# Patient Record
Sex: Female | Born: 1960 | Race: White | Hispanic: No | Marital: Married | Smoking: Never smoker
Health system: Southern US, Community
[De-identification: ages and names within clinical notes are randomized; demographics above are authoritative.]

## PROBLEM LIST (undated history)

## (undated) DIAGNOSIS — F419 Anxiety disorder, unspecified: Secondary | ICD-10-CM

## (undated) DIAGNOSIS — F32A Depression, unspecified: Secondary | ICD-10-CM

## (undated) DIAGNOSIS — Z8669 Personal history of other diseases of the nervous system and sense organs: Secondary | ICD-10-CM

## (undated) DIAGNOSIS — K219 Gastro-esophageal reflux disease without esophagitis: Secondary | ICD-10-CM

## (undated) DIAGNOSIS — M1990 Unspecified osteoarthritis, unspecified site: Secondary | ICD-10-CM

## (undated) DIAGNOSIS — F329 Major depressive disorder, single episode, unspecified: Secondary | ICD-10-CM

## (undated) HISTORY — PX: ABDOMINAL HYSTERECTOMY: SHX81

## (undated) HISTORY — PX: ABDOMINAL EXPLORATION SURGERY: SHX538

## (undated) HISTORY — PX: CHOLECYSTECTOMY: SHX55

---

## 2000-02-04 HISTORY — PX: GASTRIC BYPASS: SHX52

## 2000-02-21 ENCOUNTER — Encounter: Admission: RE | Admit: 2000-02-21 | Discharge: 2000-02-21 | Payer: Self-pay | Admitting: Gastroenterology

## 2000-02-21 ENCOUNTER — Encounter: Payer: Self-pay | Admitting: Gastroenterology

## 2000-05-20 ENCOUNTER — Ambulatory Visit (HOSPITAL_COMMUNITY): Admission: RE | Admit: 2000-05-20 | Discharge: 2000-05-20 | Payer: Self-pay | Admitting: Gastroenterology

## 2000-05-20 ENCOUNTER — Encounter (INDEPENDENT_AMBULATORY_CARE_PROVIDER_SITE_OTHER): Payer: Self-pay | Admitting: *Deleted

## 2000-07-22 ENCOUNTER — Ambulatory Visit (HOSPITAL_COMMUNITY): Admission: RE | Admit: 2000-07-22 | Discharge: 2000-07-22 | Payer: Self-pay | Admitting: Internal Medicine

## 2000-07-22 ENCOUNTER — Encounter: Payer: Self-pay | Admitting: Internal Medicine

## 2001-07-26 ENCOUNTER — Other Ambulatory Visit: Admission: RE | Admit: 2001-07-26 | Discharge: 2001-07-26 | Payer: Self-pay | Admitting: Obstetrics and Gynecology

## 2001-10-26 ENCOUNTER — Encounter: Payer: Self-pay | Admitting: Internal Medicine

## 2001-10-26 ENCOUNTER — Ambulatory Visit (HOSPITAL_COMMUNITY): Admission: RE | Admit: 2001-10-26 | Discharge: 2001-10-26 | Payer: Self-pay | Admitting: Internal Medicine

## 2001-11-05 ENCOUNTER — Encounter: Payer: Self-pay | Admitting: Internal Medicine

## 2001-11-05 ENCOUNTER — Ambulatory Visit (HOSPITAL_COMMUNITY): Admission: RE | Admit: 2001-11-05 | Discharge: 2001-11-05 | Payer: Self-pay | Admitting: Internal Medicine

## 2002-09-19 ENCOUNTER — Other Ambulatory Visit: Admission: RE | Admit: 2002-09-19 | Discharge: 2002-09-19 | Payer: Self-pay | Admitting: Obstetrics and Gynecology

## 2003-01-12 ENCOUNTER — Ambulatory Visit (HOSPITAL_COMMUNITY): Admission: RE | Admit: 2003-01-12 | Discharge: 2003-01-12 | Payer: Self-pay | Admitting: Ophthalmology

## 2004-06-12 ENCOUNTER — Other Ambulatory Visit: Admission: RE | Admit: 2004-06-12 | Discharge: 2004-06-12 | Payer: Self-pay | Admitting: Obstetrics and Gynecology

## 2004-06-13 ENCOUNTER — Ambulatory Visit (HOSPITAL_COMMUNITY): Admission: RE | Admit: 2004-06-13 | Discharge: 2004-06-13 | Payer: Self-pay | Admitting: Obstetrics and Gynecology

## 2005-06-24 ENCOUNTER — Ambulatory Visit (HOSPITAL_COMMUNITY): Admission: RE | Admit: 2005-06-24 | Discharge: 2005-06-24 | Payer: Self-pay | Admitting: Internal Medicine

## 2005-07-04 ENCOUNTER — Encounter: Admission: RE | Admit: 2005-07-04 | Discharge: 2005-07-04 | Payer: Self-pay | Admitting: Internal Medicine

## 2005-08-11 ENCOUNTER — Encounter: Admission: RE | Admit: 2005-08-11 | Discharge: 2005-08-11 | Payer: Self-pay | Admitting: Internal Medicine

## 2006-01-29 ENCOUNTER — Other Ambulatory Visit: Admission: RE | Admit: 2006-01-29 | Discharge: 2006-01-29 | Payer: Self-pay | Admitting: Obstetrics and Gynecology

## 2006-09-21 ENCOUNTER — Ambulatory Visit (HOSPITAL_COMMUNITY): Admission: RE | Admit: 2006-09-21 | Discharge: 2006-09-21 | Payer: Self-pay | Admitting: Internal Medicine

## 2006-10-16 ENCOUNTER — Encounter: Admission: RE | Admit: 2006-10-16 | Discharge: 2006-10-16 | Payer: Self-pay | Admitting: Surgery

## 2006-11-30 ENCOUNTER — Ambulatory Visit (HOSPITAL_COMMUNITY): Admission: RE | Admit: 2006-11-30 | Discharge: 2006-11-30 | Payer: Self-pay | Admitting: Internal Medicine

## 2008-04-11 ENCOUNTER — Ambulatory Visit (HOSPITAL_COMMUNITY): Admission: RE | Admit: 2008-04-11 | Discharge: 2008-04-11 | Payer: Self-pay | Admitting: Internal Medicine

## 2008-05-03 ENCOUNTER — Ambulatory Visit (HOSPITAL_COMMUNITY): Admission: RE | Admit: 2008-05-03 | Discharge: 2008-05-03 | Payer: Self-pay | Admitting: Orthopedic Surgery

## 2008-10-13 ENCOUNTER — Encounter: Payer: Self-pay | Admitting: Obstetrics and Gynecology

## 2008-10-13 ENCOUNTER — Ambulatory Visit: Payer: Self-pay | Admitting: Obstetrics and Gynecology

## 2008-10-13 ENCOUNTER — Other Ambulatory Visit: Admission: RE | Admit: 2008-10-13 | Discharge: 2008-10-13 | Payer: Self-pay | Admitting: Obstetrics and Gynecology

## 2008-12-22 ENCOUNTER — Ambulatory Visit: Payer: Self-pay | Admitting: Women's Health

## 2009-11-02 ENCOUNTER — Ambulatory Visit (HOSPITAL_COMMUNITY): Admission: RE | Admit: 2009-11-02 | Discharge: 2009-11-02 | Payer: Self-pay | Admitting: Internal Medicine

## 2010-02-24 ENCOUNTER — Encounter: Payer: Self-pay | Admitting: Internal Medicine

## 2010-06-21 NOTE — Procedures (Signed)
[place]  Patient:    Allison Watts, Allison Watts                     MRN: [id number] Proc. Date: 05/20/00 Adm. Date:  [id number] Attending:  Charna Elizabeth CC:         Dr. Carylon Perches, Louann   Procedure Report  DATE OF BIRTH:  01/29/1961  REFERRING PHYSICIAN:  Dr. Carylon Perches, [place]  PROCEDURE PERFORMED:  Colonoscopy with biopsies.  ENDOSCOPIST:  Anselmo Rod, M.D.  INSTRUMENT USED:  Olympus video colonoscope.  INDICATIONS FOR PROCEDURE:  Guaiac positive stools, history of colon cancer and breast cancer, rule out colonic polyps, masses, hemorrhoids etc.  PREPROCEDURE PREPARATION:  Informed consent was procured from the patient. The patient was fasted for eight hours prior to the procedure and prepped with a bottle of magnesium citrate and a gallon of NuLytely the night prior to the procedure.  PREPROCEDURE PHYSICAL:  The patient had stable vital signs.  Neck supple. Chest clear to auscultation.  S1, S2 regular.  Abdomen soft with normal abdominal bowel sounds.  DESCRIPTION OF PROCEDURE:  The patient was placed in the left lateral decubitus position and sedated with 40 mg of Demerol and 4 mg of Versed intravenously.  Once the patient was adequately sedated and maintained on low-flow oxygen and continuous cardiac monitoring, the Olympus video colonoscope was advanced from the rectum to the cecum without difficulty.  The entire colonic mucosa appeared healthy except for a prominent fold at 20 cm that was biopsied for pathology.  This was somewhat "soft" to biopsy.  No other masses, polyps, erosions or ulcerations were seen.  The patient tolerated the procedure well without complication.  IMPRESSION:  Prominent fold at 20 cm biopsied for pathology.  Otherwise normal colon.  RECOMMENDATIONS: 1. Await pathology results. 2. Repeat guaiac studies. 3. Outpatient follow-up in the next two weeks.  RECOMMENDATIONS:DD:  05/20/00 TD:   05/20/00 Job: [id number]

## 2010-09-19 ENCOUNTER — Telehealth (INDEPENDENT_AMBULATORY_CARE_PROVIDER_SITE_OTHER): Payer: Self-pay | Admitting: *Deleted

## 2010-09-19 DIAGNOSIS — R195 Other fecal abnormalities: Secondary | ICD-10-CM

## 2010-09-19 DIAGNOSIS — Z800 Family history of malignant neoplasm of digestive organs: Secondary | ICD-10-CM

## 2010-09-19 DIAGNOSIS — R1013 Epigastric pain: Secondary | ICD-10-CM

## 2010-09-19 MED ORDER — BISACODYL-PEG-KCL-NABICAR-NACL 5-210 MG-GM PO KIT: 1.0000 | PACK | Freq: Once | ORAL | Status: DC

## 2010-09-19 NOTE — Telephone Encounter (Signed)
TCS/EGD sch'd 09/27/10 @ 8:30 (7:30), half-lytely instructions mailed

## 2010-09-26 MED ORDER — SODIUM CHLORIDE 0.45 % IV SOLN
Freq: Once | INTRAVENOUS | Status: AC
  Administered 2010-09-27: 08:00:00 via INTRAVENOUS

## 2010-09-27 ENCOUNTER — Ambulatory Visit (HOSPITAL_COMMUNITY)
Admission: RE | Admit: 2010-09-27 | Discharge: 2010-09-27 | Disposition: A | Discharge status: Home / Self Care | Payer: Managed Care, Other (non HMO) | Source: Ambulatory Visit | Attending: Internal Medicine | Admitting: Internal Medicine

## 2010-09-27 ENCOUNTER — Encounter (HOSPITAL_COMMUNITY)
Admission: RE | Disposition: A | Discharge status: Home / Self Care | Payer: Self-pay | Source: Ambulatory Visit | Attending: Internal Medicine

## 2010-09-27 ENCOUNTER — Other Ambulatory Visit (INDEPENDENT_AMBULATORY_CARE_PROVIDER_SITE_OTHER): Payer: Self-pay | Admitting: Internal Medicine

## 2010-09-27 ENCOUNTER — Encounter (HOSPITAL_COMMUNITY): Payer: Self-pay

## 2010-09-27 DIAGNOSIS — D126 Benign neoplasm of colon, unspecified: Secondary | ICD-10-CM | POA: Insufficient documentation

## 2010-09-27 DIAGNOSIS — K644 Residual hemorrhoidal skin tags: Secondary | ICD-10-CM

## 2010-09-27 DIAGNOSIS — K449 Diaphragmatic hernia without obstruction or gangrene: Secondary | ICD-10-CM

## 2010-09-27 DIAGNOSIS — R1013 Epigastric pain: Secondary | ICD-10-CM | POA: Insufficient documentation

## 2010-09-27 DIAGNOSIS — K921 Melena: Secondary | ICD-10-CM

## 2010-09-27 HISTORY — PX: COLONOSCOPY: SHX5424

## 2010-09-27 HISTORY — PX: ESOPHAGOGASTRODUODENOSCOPY: SHX5428

## 2010-09-27 HISTORY — DX: Gastro-esophageal reflux disease without esophagitis: K21.9

## 2010-09-27 SURGERY — COLONOSCOPY
Anesthesia: Moderate Sedation

## 2010-09-27 MED ORDER — MIDAZOLAM HCL 5 MG/5ML IJ SOLN
INTRAMUSCULAR | Status: AC
  Filled 2010-09-27: qty 5

## 2010-09-27 MED ORDER — MIDAZOLAM HCL 5 MG/5ML IJ SOLN
INTRAMUSCULAR | Status: DC | PRN
  Administered 2010-09-27 (×6): 2 mg via INTRAVENOUS

## 2010-09-27 MED ORDER — MEPERIDINE HCL 50 MG/ML IJ SOLN
INTRAMUSCULAR | Status: AC
  Filled 2010-09-27: qty 1

## 2010-09-27 MED ORDER — MIDAZOLAM HCL 5 MG/5ML IJ SOLN
INTRAMUSCULAR | Status: AC
  Filled 2010-09-27: qty 10

## 2010-09-27 MED ORDER — BUTAMBEN-TETRACAINE-BENZOCAINE 2-2-14 % EX AERO
INHALATION_SPRAY | CUTANEOUS | Status: DC | PRN
  Administered 2010-09-27: 2 via TOPICAL

## 2010-09-27 MED ORDER — STERILE WATER FOR IRRIGATION IR SOLN
Status: DC | PRN
  Administered 2010-09-27: 09:00:00

## 2010-09-27 MED ORDER — MEPERIDINE HCL 25 MG/ML IJ SOLN
INTRAMUSCULAR | Status: DC | PRN
  Administered 2010-09-27 (×2): 25 mg via INTRAVENOUS

## 2010-09-27 NOTE — H&P (Signed)
Allison Watts is an 50 y.o. female.   Chief Complaint: She is here for a esophagogastroduodenoscopy and colonoscopy HPI: Patient is a 50 year old Caucasian female who's been experiencing epigastric pain for the last 3-4 weeks. She denies nausea vomiting or melena. Patient states her heartburn is well controlled with PPI. However if she stops her protonix  heartburn returns. She denies dysphagia she was recently seen by Dr. Ouida Sills and noted to have heme-positive stool. She is here for EGD and colonoscopy. Patient does not take any OTC NSAIDs except occasionally for headache. Family history is positive for a breast carcinoma in her mother died at age 75; primary was diagnosed several years earlier she. She had metastatic disease. Paternal grandmother had colon carcinoma and early 63s.  Past Medical History  Diagnosis Date  . GERD (gastroesophageal reflux disease)     Past Surgical History  Procedure Date  . Gastric bypass 2002  . Abdominal hysterectomy   . Cholecystectomy     Family History  Problem Relation Age of Onset  . Breast cancer Mother   . Hypertension Mother    Social History:  reports that she has never smoked. She has never used smokeless tobacco. She reports that she drinks about .6 ounces of alcohol per week. She reports that she does not use illicit drugs.  Allergies: No Known Allergies  Medications Prior to Admission  Medication Dose Route Frequency Provider Last Rate Last Dose  . 0.45 % sodium chloride infusion   Intravenous Once Malissa Hippo, MD 20 mL/hr at 09/27/10 0815    . meperidine (DEMEROL) 50 MG/ML injection           . midazolam (VERSED) 5 MG/5ML injection            Medications Prior to Admission  Medication Sig Dispense Refill  . buPROPion (WELLBUTRIN XL) 150 MG 24 hr tablet Take 150 mg by mouth daily.        Marland Kitchen estradiol (ESTRACE) 0.5 MG tablet Take 0.5 mg by mouth daily.        . pantoprazole (PROTONIX) 20 MG tablet Take 20 mg by mouth daily.         Marland Kitchen zolpidem (AMBIEN) 5 MG tablet Take 5 mg by mouth at bedtime as needed.          No results found for this or any previous visit (from the past 48 hour(s)). No results found.  Review of Systems  Constitutional: Negative for weight loss.  Gastrointestinal: Negative for nausea, vomiting, diarrhea, constipation and melena. Heartburn: well controlled with PPI. Abdominal pain: epigastric region for 3 weeks. Blood in stool: occ. but not recently.    Blood pressure 121/75, pulse 73, temperature 97.8 F (36.6 C), temperature source Oral, resp. rate 18, height 5\' 7"  (1.702 m), weight 270 lb (122.471 kg), SpO2 96.00%. Physical Exam  Constitutional: She appears well-developed and well-nourished.  HENT:  Mouth/Throat: Oropharynx is clear and moist.  Eyes: Conjunctivae are normal. No scleral icterus.  Neck: No thyromegaly present.  Cardiovascular: Normal rate, regular rhythm and normal heart sounds.   No murmur heard. Respiratory: Breath sounds normal.  GI: Soft. She exhibits no distension and no mass. There is no tenderness.  Musculoskeletal: She exhibits no edema.  Lymphadenopathy:    She has no cervical adenopathy.  Neurological: She is alert.  Skin: Skin is warm and dry.     Assessment/Plan Epigastric pain. 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 Heme positive stool. Family history of colon carcinoma  and a second-degree relative Diagnostic esophagogastroduodenoscopy followed by colonoscopy.  Saveah Bahar U 09/27/2010, 8:49 AM

## 2010-09-27 NOTE — Op Note (Signed)
EGD  and CLONOSCOPY  PROCEDURE REPORT  PATIENT:  Allison Watts  MR#:  [id number] Birthdate:  October 10, 1960, 50 y.o., female Endoscopist:  Dr. Malissa Hippo, MD Referred By:  Dr. Carylon Perches, MD Procedure Date: 09/27/2010  Procedure:   EGD & Colonoscopy  Indications:  Epigastric pain unresponsive to PPI. Patient is status post Roux-en-Y procedure for obesity about 10 years ago. Heme positive stool.   Informed Consent: Seizure and risks were reviewed with the patient. Her questions were answered. Informed consent was obtained.  Medications:  Demerol 50 mg IV Versed 12 mg IV Cetacaine spray topically for oropharyngeal anesthesia  EGD  Description of procedure:  The endoscope was introduced through the mouth and advanced to the second portion of the duodenum without difficulty or limitations. The mucosal surfaces were surveyed very carefully during advancement of the scope and upon withdrawal.  Findings:  Esophagus:  Normal mucosa throughout. GEJ:  39 cm Hiatus:  41 cm Stomach:  Moderate size gastric pouch allowing easy retroflexion; patent gastrojejunostomy. Focal area with edema and erythema with a scar proximal to the anastomosis.  Jejunum:  Jejunal mucosa examined for 20 cm and was normal  Therapeutic/Diagnostic Maneuvers Performed:  None  COLONOSCOPY Description of procedure:  After a digital rectal exam was performed, that colonoscope was advanced from the anus through the rectum and colon to the area of the cecum, ileocecal valve and appendiceal orifice. The cecum was deeply intubated. These structures were well-seen and photographed for the record. From the level of the cecum and ileocecal valve, the scope was slowly and cautiously withdrawn. The mucosal surfaces were carefully surveyed utilizing scope tip to flexion to facilitate fold flattening as needed. The scope was pulled down into the rectum where a thorough exam including retroflexion was performed.  Findings:   Prep  satisfactory. 3 mm polyp ablated via cold biopsy from transverse colon. Small external hemorrhoids.  Therapeutic/Diagnostic Maneuvers Performed:  None  Complications:  None  Cecal Withdrawal Time:  11 minutes  Impression:  Small sliding-type hernia without erosive esophagitis. Focal gastritis with a scar proximal to gastrojejunostomy. Moderate size gastric remnant. Normal jejunal mucosa for 20 cm. small polyp ablated via cold biopsy from transverse colon. External hemorrhoids  Recommendations:  Will check her H. pylori serology today. Physician will contact patient with results of biopsy and blood test. She will continue protonix as before.  Sterling Ucci U  09/27/2010 9:30 AM  CC: Dr. Carylon Perches, MD & Dr. Bonnetta Barry ref. provider found

## 2010-10-03 ENCOUNTER — Telehealth (INDEPENDENT_AMBULATORY_CARE_PROVIDER_SITE_OTHER): Payer: Self-pay | Admitting: *Deleted

## 2010-10-03 ENCOUNTER — Encounter (HOSPITAL_COMMUNITY): Payer: Self-pay | Admitting: Internal Medicine

## 2010-10-03 DIAGNOSIS — K2970 Gastritis, unspecified, without bleeding: Secondary | ICD-10-CM

## 2010-10-03 DIAGNOSIS — K449 Diaphragmatic hernia without obstruction or gangrene: Secondary | ICD-10-CM

## 2010-10-03 DIAGNOSIS — K219 Gastro-esophageal reflux disease without esophagitis: Secondary | ICD-10-CM

## 2010-10-03 NOTE — Telephone Encounter (Signed)
The Patient will have this drawn the week of 10-07-10. Order has been faxed to [facility].

## 2010-10-03 NOTE — Progress Notes (Signed)
Lab order for H-Pylori has been sent to [facility]. The patient will go next week,10-07-10.

## 2010-10-11 ENCOUNTER — Other Ambulatory Visit (INDEPENDENT_AMBULATORY_CARE_PROVIDER_SITE_OTHER): Payer: Self-pay | Admitting: Internal Medicine

## 2010-10-14 LAB — H. PYLORI ANTIBODY, IGG: H Pylori IgG: <0.4 {ISR}

## 2010-10-25 ENCOUNTER — Encounter (INDEPENDENT_AMBULATORY_CARE_PROVIDER_SITE_OTHER): Payer: Self-pay | Admitting: *Deleted

## 2010-11-15 ENCOUNTER — Other Ambulatory Visit (HOSPITAL_COMMUNITY): Payer: Self-pay | Admitting: Internal Medicine

## 2010-11-15 DIAGNOSIS — Z139 Encounter for screening, unspecified: Secondary | ICD-10-CM

## 2010-11-18 ENCOUNTER — Ambulatory Visit (HOSPITAL_COMMUNITY)
Admission: RE | Admit: 2010-11-18 | Discharge: 2010-11-18 | Disposition: A | Discharge status: Home / Self Care | Payer: Managed Care, Other (non HMO) | Source: Ambulatory Visit | Attending: Internal Medicine | Admitting: Internal Medicine

## 2010-11-18 DIAGNOSIS — Z139 Encounter for screening, unspecified: Secondary | ICD-10-CM

## 2010-11-18 DIAGNOSIS — Z1231 Encounter for screening mammogram for malignant neoplasm of breast: Secondary | ICD-10-CM | POA: Insufficient documentation

## 2011-01-09 ENCOUNTER — Ambulatory Visit (HOSPITAL_COMMUNITY)
Admission: RE | Admit: 2011-01-09 | Discharge: 2011-01-09 | Disposition: A | Discharge status: Home / Self Care | Payer: Managed Care, Other (non HMO) | Source: Ambulatory Visit | Attending: Internal Medicine | Admitting: Internal Medicine

## 2011-01-09 ENCOUNTER — Other Ambulatory Visit (HOSPITAL_COMMUNITY): Payer: Self-pay | Admitting: Internal Medicine

## 2011-01-09 DIAGNOSIS — R05 Cough: Secondary | ICD-10-CM

## 2011-01-09 DIAGNOSIS — R059 Cough, unspecified: Secondary | ICD-10-CM | POA: Insufficient documentation

## 2011-01-09 DIAGNOSIS — R918 Other nonspecific abnormal finding of lung field: Secondary | ICD-10-CM | POA: Insufficient documentation

## 2011-02-11 ENCOUNTER — Other Ambulatory Visit (HOSPITAL_COMMUNITY): Payer: Self-pay | Admitting: Internal Medicine

## 2011-02-11 ENCOUNTER — Ambulatory Visit (HOSPITAL_COMMUNITY)
Admission: RE | Admit: 2011-02-11 | Discharge: 2011-02-11 | Disposition: A | Discharge status: Home / Self Care | Payer: Managed Care, Other (non HMO) | Source: Ambulatory Visit | Attending: Internal Medicine | Admitting: Internal Medicine

## 2011-02-11 DIAGNOSIS — R0602 Shortness of breath: Secondary | ICD-10-CM | POA: Insufficient documentation

## 2011-02-11 DIAGNOSIS — Z8701 Personal history of pneumonia (recurrent): Secondary | ICD-10-CM | POA: Insufficient documentation

## 2011-10-24 ENCOUNTER — Other Ambulatory Visit (HOSPITAL_COMMUNITY): Payer: Self-pay | Admitting: Internal Medicine

## 2011-10-24 DIAGNOSIS — Z139 Encounter for screening, unspecified: Secondary | ICD-10-CM

## 2011-11-20 ENCOUNTER — Ambulatory Visit (HOSPITAL_COMMUNITY)
Admission: RE | Admit: 2011-11-20 | Discharge: 2011-11-20 | Disposition: A | Discharge status: Home / Self Care | Payer: Managed Care, Other (non HMO) | Source: Ambulatory Visit | Attending: Internal Medicine | Admitting: Internal Medicine

## 2011-11-20 DIAGNOSIS — Z1231 Encounter for screening mammogram for malignant neoplasm of breast: Secondary | ICD-10-CM | POA: Insufficient documentation

## 2011-11-20 DIAGNOSIS — Z139 Encounter for screening, unspecified: Secondary | ICD-10-CM

## 2012-04-02 ENCOUNTER — Other Ambulatory Visit (HOSPITAL_COMMUNITY): Payer: Self-pay | Admitting: Orthopedic Surgery

## 2012-04-02 DIAGNOSIS — M25561 Pain in right knee: Secondary | ICD-10-CM

## 2012-04-07 ENCOUNTER — Ambulatory Visit (HOSPITAL_COMMUNITY)
Admission: RE | Admit: 2012-04-07 | Discharge: 2012-04-07 | Disposition: A | Discharge status: Home / Self Care | Payer: Managed Care, Other (non HMO) | Source: Ambulatory Visit | Attending: Orthopedic Surgery | Admitting: Orthopedic Surgery

## 2012-04-07 DIAGNOSIS — M25561 Pain in right knee: Secondary | ICD-10-CM

## 2012-04-07 DIAGNOSIS — M25569 Pain in unspecified knee: Secondary | ICD-10-CM | POA: Insufficient documentation

## 2012-04-07 DIAGNOSIS — IMO0002 Reserved for concepts with insufficient information to code with codable children: Secondary | ICD-10-CM | POA: Insufficient documentation

## 2012-04-07 DIAGNOSIS — X500XXA Overexertion from strenuous movement or load, initial encounter: Secondary | ICD-10-CM | POA: Insufficient documentation

## 2012-10-20 ENCOUNTER — Other Ambulatory Visit (HOSPITAL_COMMUNITY): Payer: Self-pay | Admitting: Internal Medicine

## 2012-10-20 DIAGNOSIS — Z139 Encounter for screening, unspecified: Secondary | ICD-10-CM

## 2012-11-22 ENCOUNTER — Ambulatory Visit (HOSPITAL_COMMUNITY)
Admission: RE | Admit: 2012-11-22 | Discharge: 2012-11-22 | Disposition: A | Discharge status: Home / Self Care | Payer: Managed Care, Other (non HMO) | Source: Ambulatory Visit | Attending: Internal Medicine | Admitting: Internal Medicine

## 2012-11-22 DIAGNOSIS — Z1231 Encounter for screening mammogram for malignant neoplasm of breast: Secondary | ICD-10-CM | POA: Insufficient documentation

## 2012-11-22 DIAGNOSIS — Z139 Encounter for screening, unspecified: Secondary | ICD-10-CM

## 2013-10-25 ENCOUNTER — Other Ambulatory Visit (HOSPITAL_COMMUNITY): Payer: Self-pay | Admitting: Internal Medicine

## 2013-10-25 DIAGNOSIS — Z1231 Encounter for screening mammogram for malignant neoplasm of breast: Secondary | ICD-10-CM

## 2013-11-23 ENCOUNTER — Ambulatory Visit (HOSPITAL_COMMUNITY): Payer: Managed Care, Other (non HMO)

## 2013-11-25 ENCOUNTER — Ambulatory Visit (HOSPITAL_COMMUNITY)
Admission: RE | Admit: 2013-11-25 | Discharge: 2013-11-25 | Disposition: A | Discharge status: Home / Self Care | Payer: Managed Care, Other (non HMO) | Source: Ambulatory Visit | Attending: Internal Medicine | Admitting: Internal Medicine

## 2013-11-25 ENCOUNTER — Ambulatory Visit (HOSPITAL_COMMUNITY): Payer: Managed Care, Other (non HMO)

## 2013-11-25 DIAGNOSIS — Z1231 Encounter for screening mammogram for malignant neoplasm of breast: Secondary | ICD-10-CM | POA: Diagnosis present

## 2013-11-30 ENCOUNTER — Ambulatory Visit (HOSPITAL_COMMUNITY): Payer: Managed Care, Other (non HMO)

## 2013-12-02 ENCOUNTER — Ambulatory Visit (HOSPITAL_COMMUNITY): Admission: RE | Admit: 2013-12-02 | Payer: Managed Care, Other (non HMO) | Source: Ambulatory Visit

## 2014-10-23 ENCOUNTER — Other Ambulatory Visit (HOSPITAL_COMMUNITY): Payer: Self-pay | Admitting: Internal Medicine

## 2014-10-23 DIAGNOSIS — Z1231 Encounter for screening mammogram for malignant neoplasm of breast: Secondary | ICD-10-CM

## 2014-11-27 ENCOUNTER — Ambulatory Visit (HOSPITAL_COMMUNITY)
Admission: RE | Admit: 2014-11-27 | Discharge: 2014-11-27 | Disposition: A | Discharge status: Home / Self Care | Payer: Managed Care, Other (non HMO) | Source: Ambulatory Visit | Attending: Internal Medicine | Admitting: Internal Medicine

## 2014-11-27 DIAGNOSIS — Z1231 Encounter for screening mammogram for malignant neoplasm of breast: Secondary | ICD-10-CM | POA: Diagnosis present

## 2015-09-28 ENCOUNTER — Ambulatory Visit: Payer: Self-pay | Admitting: Surgery

## 2015-09-28 NOTE — H&P (Signed)
History of Present Illness Allison Watts. [name] MD; 09/28/2015 11:07 AM) The patient is a 54 year old female who presents with an incisional hernia. Referred by Dr. Asencion Noble for ventral hernia  This is a 55 year old female who is status post laparoscopic cholecystectomy and laparoscopic gastric bypass in 2001. She also has had a laparoscopic oophorectomy and hysterectomy. About 10 years ago she began noticing some swelling to the right of her umbilicus. This area would resolve when she was supine. The swelling tends to get worse when she is standing. I actually saw her in consultation on 10/09/06. A CT scan was performed around that time that showed a small ventral incisional hernia. However the patient never followed up and the hernia has not been bothering her until recently. About 2 months ago she began noticing increasing swelling as well as tenderness in this area. The pain resolves when she is supine. She denies any obstructive symptoms. There is not been a discoloration of the skin overlying the hernia. No further imaging has been performed.   CT scan 10/16/06 Clinical Data: Abdominal pain for 6 months. Question umbilical hernia.  Technique: Multidetector CT imaging of the abdomen and pelvis was performed following the standard protocol during bolus administration of intravenous contrast.  Contrast: 125 cc Omnipaque 300  Comparison: Report of CT of 02/21/2000.  ABDOMEN CT WITH CONTRAST  Findings: Minimal right base atelectasis or scar. Mild cardiomegaly. No pericardial or pleural effusion  Mild fatty infiltration of liver. Hepatomegaly at 18.7 cm. Cholecystectomy. right hepatic lobe 1.2 cm lesion on image 45 likely a cyst. Splenule.  Status post gastric bypass. Contrast in the bypassed stomach could be retrograde via the pancreatic biliary limb or represent a gastric gastric fistula. Antecolic roux loop without evidence of obstruction. Mild prominence of left abdominal  small bowel loop on image 47 is likely due to postoperative atony.  No biliary ductal dilatation.  Normal pancreas, adrenal glands, kidneys  No retroperitoneal or retrocrural adenopathy . Apparent mild colonic wall thickening on images 54-57 could be partially due to underdistention. Coronal 66. normal terminal ileum and appendix. Abdominal small bowel normal without ascites.  Ventral hernia contains only fat on image 47. Inferiorly, a fascial defect is seen with hernia on image 71, right of midline.  IMPRESSION   1. Ventral hernia or hernias containing only fat, as described. 2. 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 This could be secondary to reflux via the pancreatic biliary limb. However, gastric gastric fistula could have this appearance. If this is a clinical concern, upper GI could differentiate. 3. Cannot exclude ascending colonic wall thickening. Correlate with any risk factors or symptoms to suggest colitis. 4. Hepatomegaly and fatty infiltration of the liver.  PELVIS CT WITH CONTRAST  Findings: Pelvic bowel loops normal. No pelvic adenopathy. Normal urinary bladder. Hysterectomy. No free fluid. No adnexal mass. Ileocolic mesenteric lymph node, 7 mm, image 62, not pathologic. Normal bones.  IMPRESSION   1. Hysterectomy but no acute pelvic process.    Other Problems (Allison Watts, CMA; 09/28/2015 10:38 AM) Anxiety Disorder Depression Gastroesophageal Reflux Disease Migraine Headache Oophorectomy  Past Surgical History (Allison Watts, CMA; 09/28/2015 10:38 AM) Colon Polyp Removal - Colonoscopy Gallbladder Surgery - Laparoscopic Gastric Bypass Hysterectomy (not due to cancer) - Complete Knee Surgery Right.  Diagnostic Studies History (Allison Watts, [place]; 09/28/2015 10:38 AM) Colonoscopy 1-5 years ago Mammogram within last year Pap Smear >5 years ago  Allergies (Allison Watts, CMA; 09/28/2015 10:38  AM) No  Known Drug Allergies 09/28/2015  Medication History (Allison Watts, CMA; 09/28/2015 10:39 AM) Pantoprazole Sodium (40MG  Tablet DR, Oral) Active. ALPRAZolam (0.5MG  Tablet, Oral) Active. BuPROPion HCl ER (XL) (150MG  Tablet ER 24HR, Oral) Active. Escitalopram Oxalate (10MG  Tablet, Oral) Active. Medications Reconciled  Social History (Allison Watts, CMA; 09/28/2015 10:38 AM) Alcohol use Moderate alcohol use. Caffeine use Coffee, Tea. No drug use Tobacco use Never smoker.  Family History (Allison Watts, [place]; 09/28/2015 10:38 AM) Breast Cancer Mother. Cerebrovascular Accident Mother. Depression Mother, Sister. Diabetes Mellitus Father. Heart Disease Mother. Heart disease in female family member before age 23 Heart disease in female family member before age 44 Hypertension Sister. Migraine Headache Sister.  Pregnancy / Birth History (Allison Watts, [place]; 09/28/2015 10:38 AM) Age at menarche 31 years. Age of menopause 51-55 Contraceptive History Oral contraceptives. Gravida 0 Irregular periods Para 0     Review of Systems (Allison Watts CMA; 09/28/2015 10:38 AM) General Present- Night Sweats and Weight Gain. Not Present- Appetite Loss, Chills, Fatigue, Fever and Weight Loss. Skin Not Present- Change in Wart/Mole, Dryness, Hives, Jaundice, New Lesions, Non-Healing Wounds, Rash and Ulcer. HEENT Present- Wears glasses/contact lenses. Not Present- Earache, Hearing Loss, Hoarseness, Nose Bleed, Oral Ulcers, Ringing in the Ears, Seasonal Allergies, Sinus Pain, Sore Throat, Visual Disturbances and Yellow Eyes. Respiratory Not Present- Bloody sputum, Chronic Cough, Difficulty Breathing, Snoring and Wheezing. Breast Not Present- Breast Mass, Breast Pain, Nipple Discharge and Skin Changes. Cardiovascular Not Present- Chest Pain, Difficulty Breathing Lying Down, Leg Cramps, Palpitations, Rapid Heart Rate, Shortness of Breath and Swelling of Extremities. Gastrointestinal  Present- Abdominal Pain. Not Present- Bloating, Bloody Stool, Change in Bowel Habits, Chronic diarrhea, Constipation, Difficulty Swallowing, Excessive gas, Gets full quickly at meals, Hemorrhoids, Indigestion, Nausea, Rectal Pain and Vomiting. Female Genitourinary Not Present- Frequency, Nocturia, Painful Urination, Pelvic Pain and Urgency. Musculoskeletal Not Present- Back Pain, Joint Pain, Joint Stiffness, Muscle Pain, Muscle Weakness and Swelling of Extremities. Neurological Not Present- Decreased Memory, Fainting, Headaches, Numbness, Seizures, Tingling, Tremor, Trouble walking and Weakness. Psychiatric Not Present- Anxiety, Bipolar, Change in Sleep Pattern, Depression, Fearful and Frequent crying. Endocrine Present- Hot flashes. Not Present- Cold Intolerance, Excessive Hunger, Hair Changes, Heat Intolerance and New Diabetes. Hematology Not Present- Blood Thinners, Easy Bruising, Excessive bleeding, Gland problems, HIV and Persistent Infections.  Vitals (Allison Watts CMA; 09/28/2015 10:40 AM) 09/28/2015 10:39 AM Weight: 267.25 lb Height: 67in Height was reported by patient. Body Surface Area: 2.29 m Body Mass Index: 41.86 kg/m  Temp.: 98.67F(Oral)  Pulse: 87 (Regular)  P.OX: 93% (Room air) BP: 120/90 (Sitting, Left Arm, Standard)      Physical Exam Rodman Key K. Shelsea Hangartner MD; 09/28/2015 11:06 AM)  The physical exam findings are as follows: Note:WDWN in NAD HEENT: EOMI, sclera anicteric Neck: No masses, no thyromegaly Lungs: CTA bilaterally; normal respiratory effort CV: Regular rate and rhythm; no murmurs Abd: Obese, soft, palpable mass superior and to the right of the umbilicus. This measures about 6 cm across. This hernia reduces when she is supine. I cannot palpate the fascial defect. Ext: Well-perfused; no edema Skin: Warm, dry; no sign of jaundice    Assessment & Plan Rodman Key K. Mirinda Monte MD; 09/28/2015 10:56 AM)  VENTRAL INCISIONAL HERNIA WITHOUT OBSTRUCTION OR  GANGRENE (K43.2)  Current Plans Schedule for Surgery - Laparoscopic ventral hernia repair with mesh. The surgical procedure has been discussed with the patient. Potential risks, benefits, alternative treatments, and expected outcomes have been explained. All of the patient's questions at this time have been answered. The likelihood of reaching  the patient's treatment goal is good. The patient understand the proposed surgical procedure and wishes to proceed. Pt Education - Pamphlet Given - Laparoscopic Hernia Repair: discussed with patient and provided information.  Allison Watts. Georgette Dover, MD, [facility] Surgery  General/ Trauma Surgery  09/28/2015 11:07 AM

## 2015-10-17 ENCOUNTER — Encounter (HOSPITAL_COMMUNITY): Payer: Self-pay

## 2015-10-17 ENCOUNTER — Encounter (HOSPITAL_COMMUNITY)
Admission: RE | Admit: 2015-10-17 | Discharge: 2015-10-17 | Disposition: A | Discharge status: Home / Self Care | Payer: Managed Care, Other (non HMO) | Source: Ambulatory Visit | Attending: Surgery | Admitting: Surgery

## 2015-10-17 DIAGNOSIS — K439 Ventral hernia without obstruction or gangrene: Secondary | ICD-10-CM | POA: Insufficient documentation

## 2015-10-17 DIAGNOSIS — Z01812 Encounter for preprocedural laboratory examination: Secondary | ICD-10-CM | POA: Insufficient documentation

## 2015-10-17 HISTORY — DX: Personal history of other diseases of the nervous system and sense organs: Z86.69

## 2015-10-17 HISTORY — DX: Anxiety disorder, unspecified: F41.9

## 2015-10-17 HISTORY — DX: Major depressive disorder, single episode, unspecified: F32.9

## 2015-10-17 HISTORY — DX: Unspecified osteoarthritis, unspecified site: M19.90

## 2015-10-17 HISTORY — DX: Depression, unspecified: F32.A

## 2015-10-17 LAB — CBC
HEMATOCRIT: 47.2 % — AB (ref 36.0–46.0)
Hemoglobin: 14.9 g/dL (ref 12.0–15.0)
MCH: 31.8 pg (ref 26.0–34.0)
MCHC: 31.6 g/dL (ref 30.0–36.0)
MCV: 100.9 fL — ABNORMAL HIGH (ref 78.0–100.0)
PLATELETS: 292 10*3/uL (ref 150–400)
RBC: 4.68 MIL/uL (ref 3.87–5.11)
RDW: 13.5 % (ref 11.5–15.5)
WBC: 8 10*3/uL (ref 4.0–10.5)

## 2015-10-17 NOTE — Pre-Procedure Instructions (Signed)
Allison Watts  10/17/2015      [facility] - [address], [place] [address] Phone: [phone] Fax: [phone]    Your procedure is scheduled on Tuesday September 19.  Report to [facility] Admitting at 12:30 P.M.  Call this number if you have problems the morning of surgery:  [phone]   Remember:  Do not eat food or drink liquids after midnight.  Take these medicines the morning of surgery with A SIP OF WATER: acetaminophen (tylenol) if needed, alprazolam (Xanax) if needed, pantoprazole (protonix), escitalopram (Lexapro)  7 days prior to surgery STOP taking any Aspirin, Aleve, Naproxen, Ibuprofen, Motrin, Advil, Goody's, BC's, all herbal medications, fish oil, and all vitamins    Do not wear jewelry, make-up or nail polish.  Do not wear lotions, powders, or perfumes, or deoderant.  Do not shave 48 hours prior to surgery.    Do not bring valuables to the hospital.  [facility] is not responsible for any belongings or valuables.  Contacts, dentures or bridgework may not be worn into surgery.  Leave your suitcase in the car.  After surgery it may be brought to your room.  For patients admitted to the hospital, discharge time will be determined by your treatment team.  Patients discharged the day of surgery will not be allowed to drive home.    Special instructions:     [facility]- Preparing For Surgery  Before surgery, you can play an important role. Because skin is not sterile, your skin needs to be as free of germs as possible. You can reduce the number of germs on your skin by washing with CHG (chlorahexidine gluconate) Soap before surgery.  CHG is an antiseptic cleaner which kills germs and bonds with the skin to continue killing germs even after washing.  Please do not use if you have an allergy to CHG or antibacterial soaps. If your skin becomes reddened/irritated stop using the CHG.  Do not shave  (including legs and underarms) for at least 48 hours prior to first CHG shower. It is OK to shave your face.  Please follow these instructions carefully.   1. Shower the NIGHT BEFORE SURGERY and the MORNING OF SURGERY with CHG.   2. If you chose to wash your hair, wash your hair first as usual with your normal shampoo.  3. After you shampoo, rinse your hair and body thoroughly to remove the shampoo.  4. Use CHG as you would any other liquid soap. You can apply CHG directly to the skin and wash gently with a scrungie or a clean washcloth.   5. Apply the CHG Soap to your body ONLY FROM THE NECK DOWN.  Do not use on open wounds or open sores. Avoid contact with your eyes, ears, mouth and genitals (private parts). Wash genitals (private parts) with your normal soap.  6. Wash thoroughly, paying special attention to the area where your surgery will be performed.  7. Thoroughly rinse your body with warm water from the neck down.  8. DO NOT shower/wash with your normal soap after using and rinsing off the CHG Soap.  9. Pat yourself dry with a CLEAN TOWEL.   10. Wear CLEAN PAJAMAS   11. Place CLEAN SHEETS on your bed the night of your first shower and DO NOT SLEEP WITH PETS.    Day of Surgery: Do not apply any deodorants/lotions. Please wear clean clothes to the hospital/surgery center.

## 2015-10-17 NOTE — Progress Notes (Signed)
PCP Asencion Noble Pt with no cardiologist or cardiac workup per pt.   Pt with no complaints of chest pain, shortness of breath or signs of infection.

## 2015-10-22 MED ORDER — DEXTROSE 5 % IV SOLN
3.0000 g | INTRAVENOUS | Status: AC
  Administered 2015-10-23: 3 g via INTRAVENOUS
  Filled 2015-10-22: qty 3000

## 2015-10-23 ENCOUNTER — Ambulatory Visit (HOSPITAL_COMMUNITY): Payer: Managed Care, Other (non HMO) | Admitting: Certified Registered Nurse Anesthetist

## 2015-10-23 ENCOUNTER — Inpatient Hospital Stay (HOSPITAL_COMMUNITY)
Admission: AD | Admit: 2015-10-23 | Discharge: 2015-10-25 | DRG: 354 | Disposition: A | Discharge status: Home / Self Care | Payer: Managed Care, Other (non HMO) | Source: Ambulatory Visit | Attending: Surgery | Admitting: Surgery

## 2015-10-23 ENCOUNTER — Encounter (HOSPITAL_COMMUNITY)
Admission: AD | Disposition: A | Discharge status: Home / Self Care | Payer: Self-pay | Source: Ambulatory Visit | Attending: Surgery

## 2015-10-23 ENCOUNTER — Encounter (HOSPITAL_COMMUNITY): Payer: Self-pay | Admitting: Certified Registered Nurse Anesthetist

## 2015-10-23 DIAGNOSIS — F419 Anxiety disorder, unspecified: Secondary | ICD-10-CM | POA: Diagnosis present

## 2015-10-23 DIAGNOSIS — Z6841 Body Mass Index (BMI) 40.0 and over, adult: Secondary | ICD-10-CM

## 2015-10-23 DIAGNOSIS — K432 Incisional hernia without obstruction or gangrene: Secondary | ICD-10-CM | POA: Diagnosis present

## 2015-10-23 DIAGNOSIS — K219 Gastro-esophageal reflux disease without esophagitis: Secondary | ICD-10-CM | POA: Diagnosis present

## 2015-10-23 DIAGNOSIS — F329 Major depressive disorder, single episode, unspecified: Secondary | ICD-10-CM | POA: Diagnosis present

## 2015-10-23 HISTORY — PX: VENTRAL HERNIA REPAIR: SHX424

## 2015-10-23 HISTORY — PX: INSERTION OF MESH: SHX5868

## 2015-10-23 LAB — CBC
HCT: 44.5 % (ref 36.0–46.0)
Hemoglobin: 13.8 g/dL (ref 12.0–15.0)
MCH: 31.4 pg (ref 26.0–34.0)
MCHC: 31 g/dL (ref 30.0–36.0)
MCV: 101.1 fL — AB (ref 78.0–100.0)
PLATELETS: 313 10*3/uL (ref 150–400)
RBC: 4.4 MIL/uL (ref 3.87–5.11)
RDW: 13.4 % (ref 11.5–15.5)
WBC: 13.4 10*3/uL — AB (ref 4.0–10.5)

## 2015-10-23 LAB — CREATININE, SERUM
Creatinine, Ser: 0.75 mg/dL (ref 0.44–1.00)
GFR calc non Af Amer: >60 mL/min (ref >60)

## 2015-10-23 SURGERY — REPAIR, HERNIA, VENTRAL, LAPAROSCOPIC
Anesthesia: General | Site: Abdomen

## 2015-10-23 MED ORDER — OXYCODONE HCL 5 MG PO TABS
5.0000 mg | ORAL_TABLET | ORAL | Status: DC | PRN
  Administered 2015-10-23 – 2015-10-24 (×2): 10 mg via ORAL
  Administered 2015-10-24: 5 mg via ORAL
  Filled 2015-10-23 (×3): qty 2

## 2015-10-23 MED ORDER — MIDAZOLAM HCL 5 MG/5ML IJ SOLN
INTRAMUSCULAR | Status: DC | PRN
  Administered 2015-10-23: 2 mg via INTRAVENOUS

## 2015-10-23 MED ORDER — SUGAMMADEX SODIUM 200 MG/2ML IV SOLN
INTRAVENOUS | Status: AC
  Filled 2015-10-23: qty 2

## 2015-10-23 MED ORDER — CEFAZOLIN SODIUM-DEXTROSE 2-4 GM/100ML-% IV SOLN
2.0000 g | Freq: Three times a day (TID) | INTRAVENOUS | Status: AC
Start: 2015-10-23 — End: 2015-10-23
  Administered 2015-10-23: 2 g via INTRAVENOUS
  Filled 2015-10-23: qty 100

## 2015-10-23 MED ORDER — ROCURONIUM BROMIDE 10 MG/ML (PF) SYRINGE
PREFILLED_SYRINGE | INTRAVENOUS | Status: AC
  Filled 2015-10-23: qty 10

## 2015-10-23 MED ORDER — FENTANYL CITRATE (PF) 100 MCG/2ML IJ SOLN
INTRAMUSCULAR | Status: AC
  Filled 2015-10-23: qty 2

## 2015-10-23 MED ORDER — ONDANSETRON HCL 4 MG/2ML IJ SOLN
INTRAMUSCULAR | Status: DC | PRN
  Administered 2015-10-23: 4 mg via INTRAVENOUS

## 2015-10-23 MED ORDER — EPHEDRINE SULFATE 50 MG/ML IJ SOLN
INTRAMUSCULAR | Status: DC | PRN
  Administered 2015-10-23: 10 mg via INTRAVENOUS
  Administered 2015-10-23: 20 mg via INTRAVENOUS
  Administered 2015-10-23: 10 mg via INTRAVENOUS

## 2015-10-23 MED ORDER — METHOCARBAMOL 500 MG PO TABS
500.0000 mg | ORAL_TABLET | Freq: Four times a day (QID) | ORAL | Status: DC | PRN
  Administered 2015-10-23 – 2015-10-25 (×4): 500 mg via ORAL
  Filled 2015-10-23 (×5): qty 1

## 2015-10-23 MED ORDER — SODIUM CHLORIDE 0.9 % IR SOLN
Status: DC | PRN
  Administered 2015-10-23: 1000 mL

## 2015-10-23 MED ORDER — BUPIVACAINE-EPINEPHRINE 0.25% -1:200000 IJ SOLN
INTRAMUSCULAR | Status: DC | PRN
  Administered 2015-10-23: 26 mL

## 2015-10-23 MED ORDER — PROPOFOL 10 MG/ML IV BOLUS
INTRAVENOUS | Status: DC | PRN
  Administered 2015-10-23: 50 mg via INTRAVENOUS
  Administered 2015-10-23: 130 mg via INTRAVENOUS

## 2015-10-23 MED ORDER — ACETAMINOPHEN 500 MG PO TABS: 1000.0000 mg | ORAL_TABLET | Freq: Four times a day (QID) | ORAL | Status: DC | PRN

## 2015-10-23 MED ORDER — ZOLPIDEM TARTRATE 5 MG PO TABS: 5.0000 mg | ORAL_TABLET | Freq: Every evening | ORAL | Status: DC | PRN

## 2015-10-23 MED ORDER — MIDAZOLAM HCL 2 MG/2ML IJ SOLN
INTRAMUSCULAR | Status: AC
  Filled 2015-10-23: qty 2

## 2015-10-23 MED ORDER — SUGAMMADEX SODIUM 500 MG/5ML IV SOLN
INTRAVENOUS | Status: AC
  Filled 2015-10-23: qty 5

## 2015-10-23 MED ORDER — DIPHENHYDRAMINE HCL 50 MG/ML IJ SOLN: 25.0000 mg | Freq: Four times a day (QID) | INTRAMUSCULAR | Status: DC | PRN

## 2015-10-23 MED ORDER — ONDANSETRON 4 MG PO TBDP: 4.0000 mg | ORAL_TABLET | Freq: Four times a day (QID) | ORAL | Status: DC | PRN

## 2015-10-23 MED ORDER — HYDROMORPHONE HCL 1 MG/ML IJ SOLN
INTRAMUSCULAR | Status: AC
  Filled 2015-10-23: qty 1

## 2015-10-23 MED ORDER — LIDOCAINE HCL (CARDIAC) 20 MG/ML IV SOLN
INTRAVENOUS | Status: DC | PRN
  Administered 2015-10-23: 60 mg via INTRAVENOUS

## 2015-10-23 MED ORDER — CHLORHEXIDINE GLUCONATE CLOTH 2 % EX PADS: 6.0000 | MEDICATED_PAD | Freq: Once | CUTANEOUS | Status: DC

## 2015-10-23 MED ORDER — LACTATED RINGERS IV SOLN
INTRAVENOUS | Status: DC
  Administered 2015-10-23 – 2015-10-24 (×4): via INTRAVENOUS

## 2015-10-23 MED ORDER — HYDROMORPHONE HCL 1 MG/ML IJ SOLN: 1.0000 mg | INTRAMUSCULAR | Status: DC | PRN

## 2015-10-23 MED ORDER — ALPRAZOLAM 0.25 MG PO TABS
0.2500 mg | ORAL_TABLET | Freq: Every day | ORAL | Status: DC | PRN
  Administered 2015-10-23: 0.5 mg via ORAL
  Filled 2015-10-23: qty 2

## 2015-10-23 MED ORDER — MEPERIDINE HCL 25 MG/ML IJ SOLN: 6.2500 mg | INTRAMUSCULAR | Status: DC | PRN

## 2015-10-23 MED ORDER — MIDAZOLAM HCL 2 MG/2ML IJ SOLN: 0.5000 mg | Freq: Once | INTRAMUSCULAR | Status: DC | PRN

## 2015-10-23 MED ORDER — DIPHENHYDRAMINE HCL 25 MG PO CAPS: 25.0000 mg | ORAL_CAPSULE | Freq: Four times a day (QID) | ORAL | Status: DC | PRN

## 2015-10-23 MED ORDER — ROCURONIUM BROMIDE 100 MG/10ML IV SOLN
INTRAVENOUS | Status: DC | PRN
  Administered 2015-10-23 (×2): 20 mg via INTRAVENOUS
  Administered 2015-10-23: 30 mg via INTRAVENOUS
  Administered 2015-10-23: 50 mg via INTRAVENOUS
  Administered 2015-10-23: 10 mg via INTRAVENOUS

## 2015-10-23 MED ORDER — FENTANYL CITRATE (PF) 100 MCG/2ML IJ SOLN
INTRAMUSCULAR | Status: DC | PRN
  Administered 2015-10-23: 100 ug via INTRAVENOUS
  Administered 2015-10-23: 50 ug via INTRAVENOUS
  Administered 2015-10-23: 100 ug via INTRAVENOUS
  Administered 2015-10-23: 50 ug via INTRAVENOUS
  Administered 2015-10-23: 100 ug via INTRAVENOUS

## 2015-10-23 MED ORDER — ONDANSETRON HCL 4 MG/2ML IJ SOLN: 4.0000 mg | Freq: Four times a day (QID) | INTRAMUSCULAR | Status: DC | PRN

## 2015-10-23 MED ORDER — DEXAMETHASONE SODIUM PHOSPHATE 10 MG/ML IJ SOLN
INTRAMUSCULAR | Status: DC | PRN
  Administered 2015-10-23: 4 mg via INTRAVENOUS

## 2015-10-23 MED ORDER — ENOXAPARIN SODIUM 40 MG/0.4ML ~~LOC~~ SOLN
40.0000 mg | SUBCUTANEOUS | Status: DC
  Administered 2015-10-24: 40 mg via SUBCUTANEOUS
  Filled 2015-10-23: qty 0.4

## 2015-10-23 MED ORDER — HYDROMORPHONE HCL 1 MG/ML IJ SOLN
0.2500 mg | INTRAMUSCULAR | Status: DC | PRN
  Administered 2015-10-23 (×4): 0.5 mg via INTRAVENOUS

## 2015-10-23 MED ORDER — PANTOPRAZOLE SODIUM 40 MG PO TBEC
40.0000 mg | DELAYED_RELEASE_TABLET | Freq: Every day | ORAL | Status: DC
  Administered 2015-10-24: 40 mg via ORAL
  Filled 2015-10-23: qty 1

## 2015-10-23 MED ORDER — ONDANSETRON HCL 4 MG/2ML IJ SOLN
INTRAMUSCULAR | Status: AC
  Filled 2015-10-23: qty 2

## 2015-10-23 MED ORDER — BUPIVACAINE-EPINEPHRINE (PF) 0.25% -1:200000 IJ SOLN
INTRAMUSCULAR | Status: AC
  Filled 2015-10-23: qty 30

## 2015-10-23 MED ORDER — SUGAMMADEX SODIUM 500 MG/5ML IV SOLN
INTRAVENOUS | Status: DC | PRN
  Administered 2015-10-23: 300 mg via INTRAVENOUS

## 2015-10-23 MED ORDER — PROMETHAZINE HCL 25 MG/ML IJ SOLN: 6.2500 mg | INTRAMUSCULAR | Status: DC | PRN

## 2015-10-23 MED ORDER — DOCUSATE SODIUM 100 MG PO CAPS
100.0000 mg | ORAL_CAPSULE | Freq: Two times a day (BID) | ORAL | Status: DC
  Administered 2015-10-23 – 2015-10-24 (×2): 100 mg via ORAL
  Filled 2015-10-23 (×3): qty 1

## 2015-10-23 MED ORDER — LIDOCAINE 2% (20 MG/ML) 5 ML SYRINGE
INTRAMUSCULAR | Status: AC
  Filled 2015-10-23: qty 5

## 2015-10-23 MED ORDER — DEXAMETHASONE SODIUM PHOSPHATE 10 MG/ML IJ SOLN
INTRAMUSCULAR | Status: AC
  Filled 2015-10-23: qty 1

## 2015-10-23 MED ORDER — PROPOFOL 10 MG/ML IV BOLUS
INTRAVENOUS | Status: AC
  Filled 2015-10-23: qty 20

## 2015-10-23 MED ORDER — ESCITALOPRAM OXALATE 10 MG PO TABS
10.0000 mg | ORAL_TABLET | Freq: Every day | ORAL | Status: DC
  Administered 2015-10-24: 10 mg via ORAL
  Filled 2015-10-23: qty 1

## 2015-10-23 MED ORDER — PHENYLEPHRINE HCL 10 MG/ML IJ SOLN
INTRAMUSCULAR | Status: DC | PRN
  Administered 2015-10-23 (×2): 120 ug via INTRAVENOUS

## 2015-10-23 SURGICAL SUPPLY — 52 items
APPLIER CLIP LOGIC TI 5 (MISCELLANEOUS) IMPLANT
APPLIER CLIP ROT 10 11.4 M/L (STAPLE)
BENZOIN TINCTURE PRP APPL 2/3 (GAUZE/BANDAGES/DRESSINGS) ×3 IMPLANT
BINDER ABD UNIV 10 28-50 (GAUZE/BANDAGES/DRESSINGS) ×1 IMPLANT
BINDER ABDOM UNIV 10 (GAUZE/BANDAGES/DRESSINGS) ×3
BINDER ABDOMINAL 12 ML 46-62 (SOFTGOODS) IMPLANT
BLADE SURG ROTATE 9660 (MISCELLANEOUS) IMPLANT
CANISTER SUCTION 2500CC (MISCELLANEOUS) IMPLANT
CHLORAPREP W/TINT 26ML (MISCELLANEOUS) ×3 IMPLANT
CLIP APPLIE ROT 10 11.4 M/L (STAPLE) IMPLANT
CLOSURE WOUND 1/2 X4 (GAUZE/BANDAGES/DRESSINGS) ×1
COVER SURGICAL LIGHT HANDLE (MISCELLANEOUS) ×3 IMPLANT
DECANTER SPIKE VIAL GLASS SM (MISCELLANEOUS) IMPLANT
DERMABOND ADVANCED (GAUZE/BANDAGES/DRESSINGS) ×2
DERMABOND ADVANCED .7 DNX12 (GAUZE/BANDAGES/DRESSINGS) ×1 IMPLANT
DEVICE SECURE STRAP 25 ABSORB (INSTRUMENTS) ×6 IMPLANT
DEVICE TROCAR PUNCTURE CLOSURE (ENDOMECHANICALS) ×3 IMPLANT
DRAPE LAPAROSCOPIC ABDOMINAL (DRAPES) ×3 IMPLANT
DRSG TEGADERM 2-3/8X2-3/4 SM (GAUZE/BANDAGES/DRESSINGS) IMPLANT
ELECT REM PT RETURN 9FT ADLT (ELECTROSURGICAL) ×3
ELECTRODE REM PT RTRN 9FT ADLT (ELECTROSURGICAL) ×1 IMPLANT
FILTER SMOKE EVAC LAPAROSHD (FILTER) IMPLANT
GAUZE SPONGE 2X2 8PLY STRL LF (GAUZE/BANDAGES/DRESSINGS) ×1 IMPLANT
GLOVE BIO SURGEON STRL SZ7 (GLOVE) ×3 IMPLANT
GLOVE BIOGEL PI IND STRL 7.5 (GLOVE) ×1 IMPLANT
GLOVE BIOGEL PI INDICATOR 7.5 (GLOVE) ×2
GOWN STRL REUS W/ TWL LRG LVL3 (GOWN DISPOSABLE) ×4 IMPLANT
GOWN STRL REUS W/TWL LRG LVL3 (GOWN DISPOSABLE) ×8
KIT BASIN OR (CUSTOM PROCEDURE TRAY) ×3 IMPLANT
KIT ROOM TURNOVER OR (KITS) ×3 IMPLANT
MARKER SKIN DUAL TIP RULER LAB (MISCELLANEOUS) ×3 IMPLANT
MESH VENTRALIGHT ST 6X8 (Mesh Specialty) ×2 IMPLANT
MESH VENTRLGHT ELLIPSE 8X6XMFL (Mesh Specialty) ×1 IMPLANT
NEEDLE SPNL 22GX3.5 QUINCKE BK (NEEDLE) ×3 IMPLANT
NS IRRIG 1000ML POUR BTL (IV SOLUTION) ×3 IMPLANT
PAD ARMBOARD 7.5X6 YLW CONV (MISCELLANEOUS) ×6 IMPLANT
SCALPEL HARMONIC ACE (MISCELLANEOUS) ×3 IMPLANT
SCISSORS LAP 5X35 DISP (ENDOMECHANICALS) ×3 IMPLANT
SET IRRIG TUBING LAPAROSCOPIC (IRRIGATION / IRRIGATOR) IMPLANT
SLEEVE ENDOPATH XCEL 5M (ENDOMECHANICALS) ×3 IMPLANT
SPONGE GAUZE 2X2 STER 10/PKG (GAUZE/BANDAGES/DRESSINGS) ×2
STRIP CLOSURE SKIN 1/2X4 (GAUZE/BANDAGES/DRESSINGS) ×2 IMPLANT
SUT MNCRL AB 4-0 PS2 18 (SUTURE) ×3 IMPLANT
SUT NOVA NAB GS-21 0 18 T12 DT (SUTURE) ×6 IMPLANT
TOWEL OR 17X24 6PK STRL BLUE (TOWEL DISPOSABLE) ×3 IMPLANT
TOWEL OR 17X26 10 PK STRL BLUE (TOWEL DISPOSABLE) IMPLANT
TRAY FOLEY CATH 14FRSI W/METER (CATHETERS) IMPLANT
TRAY LAPAROSCOPIC MC (CUSTOM PROCEDURE TRAY) ×3 IMPLANT
TROCAR XCEL BLUNT TIP 100MML (ENDOMECHANICALS) IMPLANT
TROCAR XCEL NON-BLD 11X100MML (ENDOMECHANICALS) ×3 IMPLANT
TROCAR XCEL NON-BLD 5MMX100MML (ENDOMECHANICALS) ×3 IMPLANT
TUBING INSUFFLATION (TUBING) ×3 IMPLANT

## 2015-10-23 NOTE — H&P (View-Only) (Signed)
History of Present Illness Allison Watts. Allison Riner MD; 09/28/2015 11:07 AM) The patient is a 55 year old female who presents with an incisional hernia. Referred by Dr. Asencion Noble for ventral hernia  This is a 55 year old female who is status post laparoscopic cholecystectomy and laparoscopic gastric bypass in 2001. She also has had a laparoscopic oophorectomy and hysterectomy. About 10 years ago she began noticing some swelling to the right of her umbilicus. This area would resolve when she was supine. The swelling tends to get worse when she is standing. I actually saw her in consultation on 10/09/06. A CT scan was performed around that time that showed a small ventral incisional hernia. However the patient never followed up and the hernia has not been bothering her until recently. About 2 months ago she began noticing increasing swelling as well as tenderness in this area. The pain resolves when she is supine. She denies any obstructive symptoms. There is not been a discoloration of the skin overlying the hernia. No further imaging has been performed.   CT scan 10/16/06 Clinical Data: Abdominal pain for 6 months. Question umbilical hernia.  Technique: Multidetector CT imaging of the abdomen and pelvis was performed following the standard protocol during bolus administration of intravenous contrast.  Contrast: 125 cc Omnipaque 300  Comparison: Report of CT of 02/21/2000.  ABDOMEN CT WITH CONTRAST  Findings: Minimal right base atelectasis or scar. Mild cardiomegaly. No pericardial or pleural effusion  Mild fatty infiltration of liver. Hepatomegaly at 18.7 cm. Cholecystectomy. right hepatic lobe 1.2 cm lesion on image 45 likely a cyst. Splenule.  Status post gastric bypass. Contrast in the bypassed stomach could be retrograde via the pancreatic biliary limb or represent a gastric gastric fistula. Antecolic roux loop without evidence of obstruction. Mild prominence of left abdominal  small bowel loop on image 47 is likely due to postoperative atony.  No biliary ductal dilatation.  Normal pancreas, adrenal glands, kidneys  No retroperitoneal or retrocrural adenopathy . Apparent mild colonic wall thickening on images 54-57 could be partially due to underdistention. Coronal 66. normal terminal ileum and appendix. Abdominal small bowel normal without ascites.  Ventral hernia contains only fat on image 47. Inferiorly, a fascial defect is seen with hernia on image 43, right of midline.  IMPRESSION   1. Ventral hernia or hernias containing only fat, as described. 2. 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 This could be secondary to reflux via the pancreatic biliary limb. However, gastric gastric fistula could have this appearance. If this is a clinical concern, upper GI could differentiate. 3. Cannot exclude ascending colonic wall thickening. Correlate with any risk factors or symptoms to suggest colitis. 4. Hepatomegaly and fatty infiltration of the liver.  PELVIS CT WITH CONTRAST  Findings: Pelvic bowel loops normal. No pelvic adenopathy. Normal urinary bladder. Hysterectomy. No free fluid. No adnexal mass. Ileocolic mesenteric lymph node, 7 mm, image 62, not pathologic. Normal bones.  IMPRESSION   1. Hysterectomy but no acute pelvic process.    Other Problems ([name], CMA; 09/28/2015 10:38 AM) Anxiety Disorder Depression Gastroesophageal Reflux Disease Migraine Headache Oophorectomy  Past Surgical History ([name], CMA; 09/28/2015 10:38 AM) Colon Polyp Removal - Colonoscopy Gallbladder Surgery - Laparoscopic Gastric Bypass Hysterectomy (not due to cancer) - Complete Knee Surgery Right.  Diagnostic Studies History ([name], [place]; 09/28/2015 10:38 AM) Colonoscopy 1-5 years ago Mammogram within last year Pap Smear >5 years ago  Allergies ([name], CMA; 09/28/2015 10:38  AM) No  Known Drug Allergies 09/28/2015  Medication History ([name], CMA; 09/28/2015 10:39 AM) Pantoprazole Sodium (40MG  Tablet DR, Oral) Active. ALPRAZolam (0.5MG  Tablet, Oral) Active. BuPROPion HCl ER (XL) (150MG  Tablet ER 24HR, Oral) Active. Escitalopram Oxalate (10MG  Tablet, Oral) Active. Medications Reconciled  Social History ([name], CMA; 09/28/2015 10:38 AM) Alcohol use Moderate alcohol use. Caffeine use Coffee, Tea. No drug use Tobacco use Never smoker.  Family History ([name], [place]; 09/28/2015 10:38 AM) Breast Cancer Mother. Cerebrovascular Accident Mother. Depression Mother, Sister. Diabetes Mellitus Father. Heart Disease Mother. Heart disease in female family member before age 45 Heart disease in female family member before age 67 Hypertension Sister. Migraine Headache Sister.  Pregnancy / Birth History ([name], [place]; 09/28/2015 10:38 AM) Age at menarche 55 years. Age of menopause 51-55 Contraceptive History Oral contraceptives. Gravida 0 Irregular periods Para 0     Review of Systems ([name] CMA; 09/28/2015 10:38 AM) General Present- Night Sweats and Weight Gain. Not Present- Appetite Loss, Chills, Fatigue, Fever and Weight Loss. Skin Not Present- Change in Wart/Mole, Dryness, Hives, Jaundice, New Lesions, Non-Healing Wounds, Rash and Ulcer. HEENT Present- Wears glasses/contact lenses. Not Present- Earache, Hearing Loss, Hoarseness, Nose Bleed, Oral Ulcers, Ringing in the Ears, Seasonal Allergies, Sinus Pain, Sore Throat, Visual Disturbances and Yellow Eyes. Respiratory Not Present- Bloody sputum, Chronic Cough, Difficulty Breathing, Snoring and Wheezing. Breast Not Present- Breast Mass, Breast Pain, Nipple Discharge and Skin Changes. Cardiovascular Not Present- Chest Pain, Difficulty Breathing Lying Down, Leg Cramps, Palpitations, Rapid Heart Rate, Shortness of Breath and Swelling of Extremities. Gastrointestinal  Present- Abdominal Pain. Not Present- Bloating, Bloody Stool, Change in Bowel Habits, Chronic diarrhea, Constipation, Difficulty Swallowing, Excessive gas, Gets full quickly at meals, Hemorrhoids, Indigestion, Nausea, Rectal Pain and Vomiting. Female Genitourinary Not Present- Frequency, Nocturia, Painful Urination, Pelvic Pain and Urgency. Musculoskeletal Not Present- Back Pain, Joint Pain, Joint Stiffness, Muscle Pain, Muscle Weakness and Swelling of Extremities. Neurological Not Present- Decreased Memory, Fainting, Headaches, Numbness, Seizures, Tingling, Tremor, Trouble walking and Weakness. Psychiatric Not Present- Anxiety, Bipolar, Change in Sleep Pattern, Depression, Fearful and Frequent crying. Endocrine Present- Hot flashes. Not Present- Cold Intolerance, Excessive Hunger, Hair Changes, Heat Intolerance and New Diabetes. Hematology Not Present- Blood Thinners, Easy Bruising, Excessive bleeding, Gland problems, HIV and Persistent Infections.  Vitals ([name] CMA; 09/28/2015 10:40 AM) 09/28/2015 10:39 AM Weight: 267.25 lb Height: 67in Height was reported by patient. Body Surface Area: 2.29 m Body Mass Index: 41.86 kg/m  Temp.: 98.2F(Oral)  Pulse: 87 (Regular)  P.OX: 93% (Room air) BP: 120/90 (Sitting, Left Arm, Standard)      Physical Exam Rodman Key K. Madgie Dhaliwal MD; 09/28/2015 11:06 AM)  The physical exam findings are as follows: Note:WDWN in NAD HEENT: EOMI, sclera anicteric Neck: No masses, no thyromegaly Lungs: CTA bilaterally; normal respiratory effort CV: Regular rate and rhythm; no murmurs Abd: Obese, soft, palpable mass superior and to the right of the umbilicus. This measures about 6 cm across. This hernia reduces when she is supine. I cannot palpate the fascial defect. Ext: Well-perfused; no edema Skin: Warm, dry; no sign of jaundice    Assessment & Plan Rodman Key K. Coulton Schlink MD; 09/28/2015 10:56 AM)  VENTRAL INCISIONAL HERNIA WITHOUT OBSTRUCTION OR  GANGRENE (K43.2)  Current Plans Schedule for Surgery - Laparoscopic ventral hernia repair with mesh. The surgical procedure has been discussed with the patient. Potential risks, benefits, alternative treatments, and expected outcomes have been explained. All of the patient's questions at this time have been answered. The likelihood of reaching  the patient's treatment goal is good. The patient understand the proposed surgical procedure and wishes to proceed. Pt Education - Pamphlet Given - Laparoscopic Hernia Repair: discussed with patient and provided information.  Allison Watts. Georgette Dover, MD, [facility]  General/ Trauma Surgery  09/28/2015 11:07 AM

## 2015-10-23 NOTE — Anesthesia Postprocedure Evaluation (Signed)
Anesthesia Post Note  Patient: Allison Watts  Procedure(s) Performed: Procedure(s) (LRB): LAPAROSCOPIC VENTRAL HERNIA REPAIR WITH MESH (N/A) INSERTION OF MESH (N/A)  Patient location during evaluation: PACU Anesthesia Type: General Level of consciousness: awake and alert Pain management: pain level controlled Vital Signs Assessment: post-procedure vital signs reviewed and stable Respiratory status: spontaneous breathing, nonlabored ventilation, respiratory function stable and patient connected to nasal cannula oxygen Cardiovascular status: blood pressure returned to baseline and stable Postop Assessment: no signs of nausea or vomiting Anesthetic complications: no    Last Vitals:  Vitals:   10/23/15 1645 10/23/15 1700  BP: 133/67 132/62  Pulse: 95 93  Resp: 13 12  Temp:      Last Pain:  Vitals:   10/23/15 1705  PainSc: Asleep                 Reginal Lutes

## 2015-10-23 NOTE — Op Note (Signed)
Preop diagnosis: Ventral incisional hernia Postop diagnosis: Ventral incisional hernias 3 Procedure performed: Laparoscopic ventral hernia repair with mesh (20 x 15 cm ventralight ST mesh) Surgeon:Adelina Collard K. Anesthesia: Gen. endotracheal Indications:  This is a 55 year old female who is status post laparoscopic cholecystectomy and laparoscopic gastric bypass in 2001. She has had a 10 year history of some swelling her umbilicus. This has become larger and more comfortable. She presents now for laparoscopic repair.  Description of procedure: The patient brought to the operating room placed in supine position operative table. After an adequate level of general anesthesia was obtained, her abdomen was prepped with ChloraPrep and draped in sterile fashion. A timeout was taken to ensure the proper patient and proper procedure. We selected point below her left costal margin. A 5 mm Optiview trocar was then used to cannulate her peritoneal cavity under direct vision. We insufflated CO2 maintaining a maximum pressure of 15 mmHg. The laparoscope was inserted. The omentum is herniated through a defect at the umbilicus. There are some omental adhesions to the edge of the falciform ligament. However there are minimal other adhesions. We placed a 10 mm port and another 5 mm port lower on the left side. Using manual pressure were able to reduce a large amount of omentum out of the main hernia defect that is at the umbilicus. We located a second hernia defect just above the first hernia defect. We reduced a large amount omentum out of this as well. There is a small 1 cm defect just to the left of the umbilicus. We cleared the adhesions away from the falciform ligament. There does not seem to be any type of hernia in the area of the falciform ligament. The falciform ligament was taken down with the harmonic scalpel. The patient has some flimsy adhesions down into the pelvis from her previous Pfannenstiel incision but  there does not seem to be any hernia in this area. Once we had cleared all the adhesions from anterior abdominal wall, a spinal needle was used to determine the extent of the 3 hernia defects around the umbilicus. This covered an area measuring 11 x 9 cm. We selected a 20 x 15 cm piece of ventral light ST mesh. We placed 8 stay sutures of 0 Novafil around the edges. The mesh was rolled up and then inserted through the 10 mm port. It was unfurled inside the abdomen. We made 8 small stab incisions and pulled up the appropriate stay sutures through each stab incision. The stay sutures were then tied down. The secure strap device was used to place 2 concentric rows of tacks around the edges of the mesh. There was good coverage of all 3 hernia defects with good overlap. Hemostasis was good.  We then closed our 10 mm port site with a 0 Vicryl using the Endo Close device. The 25 mm trochars were opened to release pneumoperitoneum. Trochars were removed. We closed skin incisions with 4-0 Monocryl. Dermabond was used to seal all the incisions. An abdominal binder was placed.  The patient was then extubated and brought to the recovery room in stable condition. All sponge, instrument, and needle counts are correct.  Imogene Burn. Georgette Dover, MD, [facility] Surgery  General/ Trauma Surgery  10/23/2015 3:28 PM

## 2015-10-23 NOTE — Transfer of Care (Signed)
Immediate Anesthesia Transfer of Care Note  Patient: Allison Watts  Procedure(s) Performed: Procedure(s): LAPAROSCOPIC VENTRAL HERNIA REPAIR WITH MESH (N/A) INSERTION OF MESH (N/A)  Patient Location: PACU  Anesthesia Type:General  Level of Consciousness: awake, alert , oriented and patient cooperative  Airway & Oxygen Therapy: Patient Spontanous Breathing and Patient connected to nasal cannula oxygen  Post-op Assessment: Report given to RN and Post -op Vital signs reviewed and stable  Post vital signs: Reviewed and stable  Last Vitals:  Vitals:   10/23/15 1152 10/23/15 1527  BP: (!) 156/66   Pulse: 72 99  Resp:  15  Temp: 37.1 C 36.3 C    Last Pain: There were no vitals filed for this visit.       Complications: No apparent anesthesia complications

## 2015-10-23 NOTE — Anesthesia Procedure Notes (Signed)
Procedure Name: Intubation Date/Time: 10/23/2015 1:39 PM Performed by: Salli Quarry Donabelle Molden Pre-anesthesia Checklist: Patient identified, Emergency Drugs available, Suction available and Patient being monitored Patient Re-evaluated:Patient Re-evaluated prior to inductionOxygen Delivery Method: Circle System Utilized Preoxygenation: Pre-oxygenation with 100% oxygen Intubation Type: IV induction Ventilation: Two handed mask ventilation required and Oral airway inserted - appropriate to patient size Laryngoscope Size: Mac and 3 Grade View: Grade II Tube type: Oral Tube size: 7.0 mm Number of attempts: 1 Airway Equipment and Method: Stylet and Oral airway Placement Confirmation: ETT inserted through vocal cords under direct vision,  positive ETCO2 and breath sounds checked- equal and bilateral Secured at: 22 cm Tube secured with: Tape Dental Injury: Teeth and Oropharynx as per pre-operative assessment

## 2015-10-23 NOTE — Interval H&P Note (Signed)
History and Physical Interval Note:  10/23/2015 12:55 PM  Allison Watts  has presented today for surgery, with the diagnosis of ventral incisional hernia  The various methods of treatment have been discussed with the patient and family. After consideration of risks, benefits and other options for treatment, the patient has consented to  Procedure(s): [place] (N/A) INSERTION OF MESH (N/A) as a surgical intervention .  The patient's history has been reviewed, patient examined, no change in status, stable for surgery.  I have reviewed the patient's chart and labs.  Questions were answered to the patient's satisfaction.     Yuri Flener K.

## 2015-10-23 NOTE — Anesthesia Preprocedure Evaluation (Signed)
Anesthesia Evaluation  Patient identified by MRN, date of birth, ID band Patient awake    Reviewed: Allergy & Precautions, NPO status , Patient's Chart, lab work & pertinent test results  History of Anesthesia Complications Negative for: history of anesthetic complications  Airway Mallampati: II  TM Distance: >3 FB Neck ROM: Full    Dental  (+) Dental Advisory Given   Pulmonary neg pulmonary ROS,    breath sounds clear to auscultation       Cardiovascular negative cardio ROS   Rhythm:Regular Rate:Normal     Neuro/Psych Anxiety Depression negative neurological ROS     GI/Hepatic Neg liver ROS, GERD  Medicated and Controlled,  Endo/Other  Morbid obesity  Renal/GU negative Renal ROS     Musculoskeletal  (+) Arthritis ,   Abdominal (+) + obese,   Peds  Hematology   Anesthesia Other Findings   Reproductive/Obstetrics                             Anesthesia Physical Anesthesia Plan  ASA: II  Anesthesia Plan: General   Post-op Pain Management:    Induction: Intravenous  Airway Management Planned: Oral ETT  Additional Equipment:   Intra-op Plan:   Post-operative Plan: Extubation in OR  Informed Consent: I have reviewed the patients History and Physical, chart, labs and discussed the procedure including the risks, benefits and alternatives for the proposed anesthesia with the patient or authorized representative who has indicated his/her understanding and acceptance.   Dental advisory given  Plan Discussed with: CRNA and Surgeon  Anesthesia Plan Comments: (Plan routine monitors, GETA)        Anesthesia Quick Evaluation

## 2015-10-24 ENCOUNTER — Encounter (HOSPITAL_COMMUNITY): Payer: Self-pay | Admitting: Surgery

## 2015-10-24 LAB — BASIC METABOLIC PANEL
ANION GAP: 10 (ref 5–15)
BUN: 12 mg/dL (ref 6–20)
CALCIUM: 9.1 mg/dL (ref 8.9–10.3)
CO2: 26 mmol/L (ref 22–32)
Chloride: 102 mmol/L (ref 101–111)
Creatinine, Ser: 0.7 mg/dL (ref 0.44–1.00)
Glucose, Bld: 122 mg/dL — ABNORMAL HIGH (ref 65–99)
Potassium: 4 mmol/L (ref 3.5–5.1)
Sodium: 138 mmol/L (ref 135–145)

## 2015-10-24 LAB — CBC
HCT: 42.2 % (ref 36.0–46.0)
HEMOGLOBIN: 13.2 g/dL (ref 12.0–15.0)
MCH: 31.6 pg (ref 26.0–34.0)
MCHC: 31.3 g/dL (ref 30.0–36.0)
MCV: 101 fL — ABNORMAL HIGH (ref 78.0–100.0)
Platelets: 315 10*3/uL (ref 150–400)
RBC: 4.18 MIL/uL (ref 3.87–5.11)
RDW: 13.4 % (ref 11.5–15.5)
WBC: 11.5 10*3/uL — AB (ref 4.0–10.5)

## 2015-10-24 MED ORDER — INFLUENZA VAC SPLIT QUAD 0.5 ML IM SUSY: 0.5000 mL | PREFILLED_SYRINGE | INTRAMUSCULAR | Status: DC

## 2015-10-24 MED ORDER — HYDROMORPHONE HCL 2 MG PO TABS
2.0000 mg | ORAL_TABLET | ORAL | Status: DC | PRN
  Administered 2015-10-24 – 2015-10-25 (×6): 2 mg via ORAL
  Filled 2015-10-24 (×7): qty 1

## 2015-10-24 MED ORDER — KETOROLAC TROMETHAMINE 30 MG/ML IJ SOLN
30.0000 mg | Freq: Four times a day (QID) | INTRAMUSCULAR | Status: DC
  Administered 2015-10-24 – 2015-10-25 (×5): 30 mg via INTRAVENOUS
  Filled 2015-10-24 (×5): qty 1

## 2015-10-24 NOTE — Progress Notes (Signed)
1 Day Post-Op  Subjective: Patient is having significant pain - controlled with IV Dilaudid; PO Oxycodone not particularly helpful No nausea Tolerating clears Voiding well  Objective: Vital signs in last 24 hours: Temp:  [97.3 F (36.3 C)-98.7 F (37.1 C)] 98.1 F (36.7 C) (09/20 0519) Pulse Rate:  [64-99] 64 (09/20 0519) Resp:  [10-16] 16 (09/20 0519) BP: (109-156)/(50-101) 128/65 (09/20 0519) SpO2:  [93 %-96 %] 93 % (09/20 0519) Weight:  [122.7 kg (270 lb 8 oz)] 122.7 kg (270 lb 8 oz) (09/20 0141)    Intake/Output from previous day: 09/19 0701 - 09/20 0700 In: 2986.7 [P.O.:720; I.V.:2266.7] Out: 230 [Urine:200; Blood:30] Intake/Output this shift: No intake/output data recorded.  General appearance: alert, cooperative and no distress Resp: clear to auscultation bilaterally Cardio: regular rate and rhythm, S1, S2 normal, no murmur, click, rub or gallop GI: soft, + BS; tender around left sided port sites Incisions sealed; no drainage  Lab Results:   Recent Labs  10/23/15 1902 10/24/15 0550  WBC 13.4* 11.5*  HGB 13.8 13.2  HCT 44.5 42.2  PLT 313 315   BMET  Recent Labs  10/23/15 1902 10/24/15 0550  NA  --  138  K  --  4.0  CL  --  102  CO2  --  26  GLUCOSE  --  122*  BUN  --  12  CREATININE 0.75 0.70  CALCIUM  --  9.1   PT/INR No results for input(s): LABPROT, INR in the last 72 hours. ABG No results for input(s): PHART, HCO3 in the last 72 hours.  Invalid input(s): PCO2, PO2  Studies/Results: No results found.  Anti-infectives: Anti-infectives    Start     Dose/Rate Route Frequency Ordered Stop   10/23/15 2200  ceFAZolin (ANCEF) IVPB 2g/100 mL premix     2 g 200 mL/hr over 30 Minutes Intravenous Every 8 hours 10/23/15 1844 10/23/15 2221   10/23/15 1400  ceFAZolin (ANCEF) 3 g in dextrose 5 % 50 mL IVPB     3 g 130 mL/hr over 30 Minutes Intravenous To ShortStay Surgical 10/22/15 1110 10/23/15 1345      Assessment/Plan: s/p  Procedure(s): LAPAROSCOPIC VENTRAL HERNIA REPAIR WITH MESH (N/A) INSERTION OF MESH (N/A) Advance diet Plan for discharge tomorrow Change Oxycodone to PO Dilaudid  Add Toradol x 24 hours Encourage ambulation/ IS Wearing abdominal binder   LOS: 1 day    Jachin Coury K. 10/24/2015

## 2015-10-25 MED ORDER — HYDROMORPHONE HCL 2 MG PO TABS: 2.0000 mg | ORAL_TABLET | ORAL | 0 refills | Status: DC | PRN

## 2015-10-25 MED ORDER — METHOCARBAMOL 500 MG PO TABS: 500.0000 mg | ORAL_TABLET | Freq: Four times a day (QID) | ORAL | 0 refills | Status: DC | PRN

## 2015-10-25 NOTE — Discharge Summary (Signed)
Physician Discharge Summary  Patient ID: Allison Watts MRN: [id number] DOB/AGE: Jul 28, 1960 55 y.o.  Admit date: 10/23/2015 Discharge date: 10/25/2015  Admission Diagnoses:  Ventral incisional hernias  Discharge Diagnoses: same Active Problems:   Ventral incisional hernia   Discharged Condition: good  Hospital Course: Laparoscopic repair of ventral incisional hernia with mesh on 9/19.  She was kept in the hospital for pain control.  She required PO dilaudid for adequate management of her pain.  On POD#2 she is feeling much better and feels ready for discharge.  Consults: None  Significant Diagnostic Studies: none  Treatments: surgery: lap ventral hernia repair with mesh  Discharge Exam: Blood pressure 112/68, pulse 60, temperature 98.3 F (36.8 C), temperature source Oral, resp. rate 17, height '5\' 7"'  (1.702 m), weight 122.7 kg (270 lb 8 oz), SpO2 92 %. General appearance: alert, cooperative and no distress Resp: clear to auscultation bilaterally Cardio: regular rate and rhythm, S1, S2 normal, no murmur, click, rub or gallop GI: soft, flat, tenderness around left-sided port sites Incisions - clean, dry, intact  Disposition: 01-Home or Self Care  Discharge Instructions    Call MD for:  persistant nausea and vomiting    Complete by:  As directed    Call MD for:  redness, tenderness, or signs of infection (pain, swelling, redness, odor or green/yellow discharge around incision site)    Complete by:  As directed    Call MD for:  severe uncontrolled pain    Complete by:  As directed    Call MD for:  temperature >100.4    Complete by:  As directed    Diet general    Complete by:  As directed    Driving Restrictions    Complete by:  As directed    Do not drive while taking pain medications   Increase activity slowly    Complete by:  As directed    May shower / Bathe    Complete by:  As directed        Medication List    TAKE these medications   acetaminophen 500 MG  tablet Commonly known as:  TYLENOL Take 1,000 mg by mouth every 6 (six) hours as needed for mild pain.   ALPRAZolam 0.5 MG tablet Commonly known as:  XANAX Take 0.25-0.5 mg by mouth daily as needed for anxiety (depends on anxiety level if takes 0.25-0.5).   Bisacodyl-PEG-KCl-NaBicar-NaCl 5-210 MG-GM kit Commonly known as:  HALFLYTELY WITH FLAVOR PACKS Take 1 kit by mouth once.   escitalopram 10 MG tablet Commonly known as:  LEXAPRO Take 10 mg by mouth daily.   HYDROmorphone 2 MG tablet Commonly known as:  DILAUDID Take 1 tablet (2 mg total) by mouth every 4 (four) hours as needed for severe pain.   methocarbamol 500 MG tablet Commonly known as:  ROBAXIN Take 1 tablet (500 mg total) by mouth every 6 (six) hours as needed for muscle spasms.   pantoprazole 40 MG tablet Commonly known as:  PROTONIX Take 40 mg by mouth daily.   Vitamin B12 1000 MCG Tbcr Take 1,000 mcg by mouth daily.      Follow-up Information    Jacksyn Beeks K., MD Follow up in 3 week(s).   Specialty:  General Surgery Contact information: [address] [place] [address] [phone]           Signed: Maia Petties. 10/25/2015, 7:22 AM

## 2015-10-25 NOTE — Progress Notes (Signed)
Phylis Bougie to be D/C'd  per MD order. Discussed with the patient and all questions fully answered.  VSS, Skin clean, dry and intact without evidence of skin break down, no evidence of skin tears noted.  IV catheter discontinued intact. Site without signs and symptoms of complications. Dressing and pressure applied.  An After Visit Summary was printed and given to the patient. Patient received prescription.  D/c education completed with patient/family including follow up instructions, medication list, d/c activities limitations if indicated, with other d/c instructions as indicated by MD - patient able to verbalize understanding, all questions fully answered.   Patient instructed to return to ED, call 911, or call MD for any changes in condition.   Patient to be escorted via [place], and D/C home via private auto.

## 2015-10-25 NOTE — Discharge Instructions (Signed)
[facility], [place] [phone]  ABDOMINAL SURGERY: POST OP INSTRUCTIONS  Always review your discharge instruction sheet given to you by the facility where your surgery was performed.  IF YOU HAVE DISABILITY OR FAMILY LEAVE FORMS, YOU MUST BRING THEM TO THE OFFICE FOR PROCESSING.  PLEASE DO NOT GIVE THEM TO YOUR DOCTOR.  1. A prescription for pain medication may be given to you upon discharge.  Take your pain medication as prescribed, if needed.  If narcotic pain medicine is not needed, then you may take acetaminophen (Tylenol) or ibuprofen (Advil) as needed. 2. Take your usually prescribed medications unless otherwise directed. 3. If you need a refill on your pain medication, please contact your pharmacy. They will contact our office to request authorization.  Prescriptions will not be filled after 5pm or on week-ends. 4. You should follow a light diet the first few days after arrival home, such as soup and crackers, pudding, etc.unless your doctor has advised otherwise. A high-fiber, low fat diet can be resumed as tolerated.   Be sure to include lots of fluids daily.  5. Most patients will experience some swelling and bruising in the area of the incision. Ice pack will help. Swelling and bruising can take several days to resolve..  6. It is common to experience some constipation if taking pain medication after surgery.  Increasing fluid intake and taking a stool softener will usually help or prevent this problem from occurring.  A mild laxative (Milk of Magnesia or Miralax) should be taken according to package directions if there are no bowel movements after 48 hours. 7.   If your surgeon used skin glue on the incision, you may shower.  The glue will flake off over the next 2-3 weeks.. Wear the abdominal binder when you are out of bed 8. ACTIVITIES:  You may resume regular (light) daily activities beginning the next day--such as daily self-care, walking, climbing  stairs--gradually increasing activities as tolerated.  You may have sexual intercourse when it is comfortable.  Refrain from any heavy lifting or straining until approved by your doctor. a. You may drive when you no longer are taking prescription pain medication, you can comfortably wear a seatbelt, and you can safely maneuver your car and apply brakes b. Return to Work: ___________________________________ 65. You should see your doctor in the office for a follow-up appointment approximately two weeks after your surgery.  Make sure that you call for this appointment within a day or two after you arrive home to insure a convenient appointment time. OTHER INSTRUCTIONS:  _____________________________________________________________ _____________________________________________________________  WHEN TO CALL YOUR DOCTOR: 1. Fever over 101.0 2. Inability to urinate 3. Nausea and/or vomiting 4. Extreme swelling or bruising 5. Continued bleeding from incision. 6. Increased pain, redness, or drainage from the incision. 7. Difficulty swallowing or breathing 8. Muscle cramping or spasms. 9. Numbness or tingling in hands or feet or around lips.  The clinic staff is available to answer your questions during regular business hours.  Please dont hesitate to call and ask to speak to one of the nurses if you have concerns.  For further questions, please visit [email or web address]

## 2015-12-12 ENCOUNTER — Other Ambulatory Visit (HOSPITAL_COMMUNITY): Payer: Self-pay | Admitting: Internal Medicine

## 2015-12-12 ENCOUNTER — Ambulatory Visit (HOSPITAL_COMMUNITY)
Admission: RE | Admit: 2015-12-12 | Discharge: 2015-12-12 | Disposition: A | Discharge status: Home / Self Care | Payer: Managed Care, Other (non HMO) | Source: Ambulatory Visit | Attending: Internal Medicine | Admitting: Internal Medicine

## 2015-12-12 DIAGNOSIS — Z1231 Encounter for screening mammogram for malignant neoplasm of breast: Secondary | ICD-10-CM

## 2015-12-12 DIAGNOSIS — M25532 Pain in left wrist: Secondary | ICD-10-CM | POA: Insufficient documentation

## 2015-12-12 DIAGNOSIS — M79642 Pain in left hand: Secondary | ICD-10-CM | POA: Insufficient documentation

## 2015-12-20 ENCOUNTER — Inpatient Hospital Stay (HOSPITAL_COMMUNITY): Admission: RE | Admit: 2015-12-20 | Payer: Managed Care, Other (non HMO) | Source: Ambulatory Visit

## 2016-01-04 ENCOUNTER — Ambulatory Visit (HOSPITAL_COMMUNITY)
Admission: RE | Admit: 2016-01-04 | Discharge: 2016-01-04 | Disposition: A | Discharge status: Home / Self Care | Payer: Managed Care, Other (non HMO) | Source: Ambulatory Visit | Attending: Internal Medicine | Admitting: Internal Medicine

## 2016-01-04 DIAGNOSIS — Z1231 Encounter for screening mammogram for malignant neoplasm of breast: Secondary | ICD-10-CM | POA: Insufficient documentation

## 2017-02-19 ENCOUNTER — Other Ambulatory Visit (HOSPITAL_COMMUNITY): Payer: Self-pay | Admitting: Internal Medicine

## 2017-02-19 DIAGNOSIS — Z1231 Encounter for screening mammogram for malignant neoplasm of breast: Secondary | ICD-10-CM

## 2017-02-26 ENCOUNTER — Encounter (HOSPITAL_COMMUNITY): Payer: Self-pay

## 2017-02-26 ENCOUNTER — Ambulatory Visit (HOSPITAL_COMMUNITY)
Admission: RE | Admit: 2017-02-26 | Discharge: 2017-02-26 | Disposition: A | Discharge status: Home / Self Care | Payer: 59 | Source: Ambulatory Visit | Attending: Internal Medicine | Admitting: Internal Medicine

## 2017-02-26 DIAGNOSIS — Z1231 Encounter for screening mammogram for malignant neoplasm of breast: Secondary | ICD-10-CM | POA: Diagnosis present

## 2018-02-19 ENCOUNTER — Other Ambulatory Visit (HOSPITAL_COMMUNITY): Payer: Self-pay | Admitting: Internal Medicine

## 2018-02-19 DIAGNOSIS — Z1231 Encounter for screening mammogram for malignant neoplasm of breast: Secondary | ICD-10-CM

## 2018-03-01 ENCOUNTER — Ambulatory Visit (HOSPITAL_COMMUNITY)
Admission: RE | Admit: 2018-03-01 | Discharge: 2018-03-01 | Disposition: A | Discharge status: Home / Self Care | Payer: 59 | Source: Ambulatory Visit | Attending: Internal Medicine | Admitting: Internal Medicine

## 2018-03-01 ENCOUNTER — Encounter (HOSPITAL_COMMUNITY): Payer: Self-pay

## 2018-03-01 DIAGNOSIS — Z1231 Encounter for screening mammogram for malignant neoplasm of breast: Secondary | ICD-10-CM

## 2018-07-27 ENCOUNTER — Ambulatory Visit (INDEPENDENT_AMBULATORY_CARE_PROVIDER_SITE_OTHER): Payer: 59 | Admitting: Internal Medicine

## 2018-07-27 ENCOUNTER — Other Ambulatory Visit: Payer: Self-pay

## 2018-07-27 ENCOUNTER — Encounter (INDEPENDENT_AMBULATORY_CARE_PROVIDER_SITE_OTHER): Payer: Self-pay | Admitting: Internal Medicine

## 2018-07-27 VITALS — BP 145/94 | HR 91 | Temp 98.0°F | Ht 66.0 in | Wt 274.5 lb

## 2018-07-27 DIAGNOSIS — K602 Anal fissure, unspecified: Secondary | ICD-10-CM | POA: Diagnosis not present

## 2018-07-27 MED ORDER — DILTIAZEM GEL 2 %: 1.0000 "application " | Freq: Two times a day (BID) | CUTANEOUS | 2 refills | Status: DC

## 2018-07-27 NOTE — Patient Instructions (Addendum)
Rx for Diltiazem 2 % gel twice a day to rectum.  May use the [facility] cream as needed

## 2018-07-27 NOTE — Progress Notes (Signed)
Subjective:    Patient ID: Allison Watts, female    DOB: 1960/06/02, 58 y.o.   MRN: [id number]  HPI Referred by Dr. Willey Blade for hemorrhoids. She talked with Dr. Willey Blade and was called in an Rx for Anucort rectal supp and [facility] Hemorrhoid Cream. The hemorrhoid has been acting up since the week of 06/21/2018. She has also been using Preparation H supp. She has been taking Doculax since Wednesday. She has pain regardless   Her last colonoscopy was in 2012 for Heme positive stool.  Findings:   Prep satisfactory. 3 mm polyp ablated via cold biopsy from transverse colon. Small external hemorrhoids. Biopsy: No evidence of adenoma.    Review of Systems Past Medical History:  Diagnosis Date  . Anxiety   . Arthritis    in knees  . Depression   . GERD (gastroesophageal reflux disease)   . History of migraine     Past Surgical History:  Procedure Laterality Date  . ABDOMINAL EXPLORATION SURGERY     removed one ovary  . ABDOMINAL HYSTERECTOMY    . CHOLECYSTECTOMY    . COLONOSCOPY  09/27/2010   Procedure: COLONOSCOPY;  Surgeon: Rogene Houston, MD;  [facility];  Service: Endoscopy;  Laterality: N/A;  8:30  . ESOPHAGOGASTRODUODENOSCOPY  09/27/2010   Procedure: ESOPHAGOGASTRODUODENOSCOPY (EGD);  Surgeon: Rogene Houston, MD;  [facility];  Service: Endoscopy;  Laterality: N/A;  . GASTRIC BYPASS  2002  . INSERTION OF MESH N/A 10/23/2015   Procedure: INSERTION OF MESH;  Surgeon: Donnie Mesa, MD;  Location: [place];  Service: General;  Laterality: N/A;  . VENTRAL HERNIA REPAIR N/A 10/23/2015   Procedure: LAPAROSCOPIC VENTRAL HERNIA REPAIR WITH MESH;  Surgeon: Donnie Mesa, MD;  Location: [place];  Service: General;  Laterality: N/A;    Allergies  Allergen Reactions  . No Known Allergies     Current Outpatient Medications on File Prior to Visit  Medication Sig Dispense Refill  . ALPRAZolam (XANAX) 0.5 MG tablet Take 0.5 mg by mouth at bedtime as needed for  anxiety.    Marland Kitchen escitalopram (LEXAPRO) 10 MG tablet Take 10 mg by mouth daily.    . pantoprazole (PROTONIX) 40 MG tablet Take 40 mg by mouth daily.    Marland Kitchen acetaminophen (TYLENOL) 500 MG tablet Take 1,000 mg by mouth every 6 (six) hours as needed for mild pain.    Marland Kitchen ALPRAZolam (XANAX) 0.5 MG tablet Take 0.25-0.5 mg by mouth daily as needed for anxiety (depends on anxiety level if takes 0.25-0.5).    Marland Kitchen Bisacodyl-PEG-KCl-NaBicar-NaCl (HALFLYTELY WITH FLAVOR PACKS) 5-210 MG-GM kit Take 1 kit by mouth once. (Patient not taking: Reported on 10/16/2015) 1 each 0  . Cyanocobalamin (VITAMIN B12) 1000 MCG TBCR Take 1,000 mcg by mouth daily.    Marland Kitchen escitalopram (LEXAPRO) 10 MG tablet Take 10 mg by mouth daily.    Marland Kitchen HYDROmorphone (DILAUDID) 2 MG tablet Take 1 tablet (2 mg total) by mouth every 4 (four) hours as needed for severe pain. (Patient not taking: Reported on 07/27/2018) 30 tablet 0  . methocarbamol (ROBAXIN) 500 MG tablet Take 1 tablet (500 mg total) by mouth every 6 (six) hours as needed for muscle spasms. (Patient not taking: Reported on 07/27/2018) 30 tablet 0   No current facility-administered medications on file prior to visit.         Objective:   Physical Exam  Alert and oriented. Skin warm and dry. Oral mucosa is moist.   . Sclera  anicteric, conjunctivae is pink. Thyroid not enlarged. No cervical lymphadenopathy. Lungs clear. Heart regular rate and rhythm.  Abdomen is soft. Bowel sounds are positive. No hepatomegaly. No abdominal masses felt. No tenderness.  No edema to lower extremities. Patient is alert and oriented. Rectal. I do not see any external hemorrhoids. Very painful for her when I did rectal. Pain located at 6: 00      Assessment & Plan:  Probable rectal fissure. Rx for Cardizem 2% bid. May continue the [facility] Hemorrhoid cream.  Call with a PR in about 1-2 weeks.

## 2018-08-09 ENCOUNTER — Other Ambulatory Visit (INDEPENDENT_AMBULATORY_CARE_PROVIDER_SITE_OTHER): Payer: Self-pay | Admitting: Internal Medicine

## 2018-08-09 ENCOUNTER — Telehealth (INDEPENDENT_AMBULATORY_CARE_PROVIDER_SITE_OTHER): Payer: Self-pay | Admitting: Internal Medicine

## 2018-08-09 DIAGNOSIS — K602 Anal fissure, unspecified: Secondary | ICD-10-CM

## 2018-08-09 DIAGNOSIS — K640 First degree hemorrhoids: Secondary | ICD-10-CM

## 2018-08-09 NOTE — Telephone Encounter (Signed)
Patient states she is better. Still having some bleeding. Am going to to refer her for hemorrhoids/? Rectal fissure.

## 2018-08-09 NOTE — Telephone Encounter (Signed)
Referral to Dr. Constance Haw

## 2018-08-09 NOTE — Telephone Encounter (Signed)
Please call patient at [phone]

## 2018-08-09 NOTE — Progress Notes (Signed)
re

## 2018-08-26 ENCOUNTER — Other Ambulatory Visit: Payer: Self-pay

## 2018-08-26 ENCOUNTER — Ambulatory Visit (INDEPENDENT_AMBULATORY_CARE_PROVIDER_SITE_OTHER): Payer: 59 | Admitting: General Surgery

## 2018-08-26 ENCOUNTER — Encounter: Payer: Self-pay | Admitting: General Surgery

## 2018-08-26 VITALS — BP 139/98 | HR 87 | Temp 97.8°F | Resp 16 | Ht 66.0 in | Wt 275.0 lb

## 2018-08-26 DIAGNOSIS — K600 Acute anal fissure: Secondary | ICD-10-CM | POA: Diagnosis not present

## 2018-08-26 NOTE — Progress Notes (Signed)
[facility] History and Physical  Reason for Referral: Anal fissure/ Hemorrhoids  Referring Physician: Deberah Castle, NP   Chief Complaint    Pre-op Exam      Allison Watts is a 58 y.o. female.  HPI: [name] is a 58 yo who had shoulder surgery May 2020, and developed some pretty severe and acute constipation. She recalls have a very hard large stool that resulted in her having extremely painful bowel movements from that point on. She was able to get her stools more soft and regular but continued to have stabbing and sharp pain with BMs to the point she would cry. She also reports some itchy and feeling moist in the area after Bms.  She had some bleeding and clots around this time She was seen by Deberah Castle, NP and diagnosed with an anal fissure. She was given diltiazem 2% cream to apply to the area, and said this helped some but stung when she applied it.  She says that over the last month she feels much better and does not have the same pain or burning with BMs. She says that she comes in today to further discuss the fissure and her options. Currently she has regular soft BMs and is taking ducolax at night to aid in having this BM.   Past Medical History:  Diagnosis Date  . Anxiety   . Arthritis    in knees  . Depression   . GERD (gastroesophageal reflux disease)   . History of migraine     Past Surgical History:  Procedure Laterality Date  . ABDOMINAL EXPLORATION SURGERY     removed one ovary  . ABDOMINAL HYSTERECTOMY    . CHOLECYSTECTOMY    . COLONOSCOPY  09/27/2010   Procedure: COLONOSCOPY;  Surgeon: Rogene Houston, MD;  [facility];  Service: Endoscopy;  Laterality: N/A;  8:30  . ESOPHAGOGASTRODUODENOSCOPY  09/27/2010   Procedure: ESOPHAGOGASTRODUODENOSCOPY (EGD);  Surgeon: Rogene Houston, MD;  [facility];  Service: Endoscopy;  Laterality: N/A;  . GASTRIC BYPASS  2002  . INSERTION OF MESH N/A 10/23/2015   Procedure: INSERTION  OF MESH;  Surgeon: Donnie Mesa, MD;  Location: [place];  Service: General;  Laterality: N/A;  . VENTRAL HERNIA REPAIR N/A 10/23/2015   Procedure: LAPAROSCOPIC VENTRAL HERNIA REPAIR WITH MESH;  Surgeon: Donnie Mesa, MD;  Location: [facility];  Service: General;  Laterality: N/A;    Family History  Problem Relation Age of Onset  . Breast cancer Mother   . Hypertension Mother     Social History   Tobacco Use  . Smoking status: Never Smoker  . Smokeless tobacco: Never Used  Substance Use Topics  . Alcohol use: Yes    Alcohol/week: 7.0 standard drinks    Types: 7 Glasses of wine per week  . Drug use: No    Medications: I have reviewed the patient's current medications. Allergies as of 08/26/2018      Reactions   No Known Allergies       Medication List       Accurate as of August 26, 2018  1:12 PM. If you have any questions, ask your nurse or doctor.        acetaminophen 500 MG tablet Commonly known as: TYLENOL Take 1,000 mg by mouth every 6 (six) hours as needed for mild pain.   ALPRAZolam 0.5 MG tablet Commonly known as: XANAX Take 0.5 mg by mouth at bedtime as needed for anxiety.  ALPRAZolam 0.5 MG tablet Commonly known as: XANAX Take 0.25-0.5 mg by mouth daily as needed for anxiety (depends on anxiety level if takes 0.25-0.5).   Bisacodyl-PEG-KCl-NaBicar-NaCl 5-210 MG-GM kit Commonly known as: HalfLytely with Flavor Packs Take 1 kit by mouth once.   diltiazem 2 % Gel Apply 1 application topically 2 (two) times daily.   escitalopram 10 MG tablet Commonly known as: LEXAPRO Take 10 mg by mouth daily.   escitalopram 10 MG tablet Commonly known as: LEXAPRO Take 10 mg by mouth daily.   HYDROmorphone 2 MG tablet Commonly known as: DILAUDID Take 1 tablet (2 mg total) by mouth every 4 (four) hours as needed for severe pain.   methocarbamol 500 MG tablet Commonly known as: ROBAXIN Take 1 tablet (500 mg total) by mouth every 6 (six) hours as needed for muscle  spasms.   pantoprazole 40 MG tablet Commonly known as: PROTONIX Take 40 mg by mouth daily.   Vitamin B12 1000 MCG Tbcr Take 1,000 mcg by mouth daily.        ROS:  A comprehensive review of systems was negative except for: Gastrointestinal: positive for painful defecation, burning  Blood pressure (!) 139/98, pulse 87, temperature 97.8 F (36.6 C), temperature source Tympanic, resp. rate 16, height '5\' 6"'  (1.676 m), weight 275 lb (124.7 kg), SpO2 92 %. Physical Exam Vitals signs reviewed.  Constitutional:      Appearance: Normal appearance.  HENT:     Head: Normocephalic.     Nose: Nose normal.     Mouth/Throat:     Mouth: Mucous membranes are moist.  Eyes:     Extraocular Movements: Extraocular movements intact.     Pupils: Pupils are equal, round, and reactive to light.  Neck:     Musculoskeletal: Normal range of motion. No neck rigidity.  Cardiovascular:     Rate and Rhythm: Normal rate and regular rhythm.  Pulmonary:     Effort: Pulmonary effort is normal.     Breath sounds: Normal breath sounds.  Abdominal:     General: There is no distension.     Palpations: Abdomen is soft.     Tenderness: There is no abdominal tenderness.  Genitourinary:    Rectum: Anal fissure present. No external hemorrhoid.     Comments: Healing posterior anal fissure, small tag just to the left of the posterior fissure developing Musculoskeletal: Normal range of motion.        General: No swelling.  Skin:    General: Skin is warm and dry.  Neurological:     General: No focal deficit present.     Mental Status: She is oriented to person, place, and time.  Psychiatric:        Mood and Affect: Mood normal.        Behavior: Behavior normal.        Thought Content: Thought content normal.        Judgment: Judgment normal.     Results: None   Assessment & Plan:  [name] is a 58 y.o. female with a healing anal fissure. This is not healed completely but is starting to heal. She  does have a small tag but this is very small and not bothering her at this time. We have discussed the pathophysiology of fissures, the reason for the pain and the cyclical nature of the pain/ tearing/ spasm of the muscle. She understands the reason behind the creams to help relax the muscle and allow healing.  I normally compound  a Diltiazem 2%/ Lidocaine 5% cream to help with more immediate soothing in the area.    [facility] for Diltiazem 2%/ Lidocaine 5% apply QID as needed; she will need to use this for a few weeks to see if this can be fully healed  -We discussed that surgery for the fissure is a lateral internal sphincterotomy and this involves cutting some of the muscle. The risk with this are bleeding, infection, and risk of injury to the sphincter/ loss of anal tone/ incontinence or not cutting enough sphincter and still having some degree of spasm. She understands that this is a last resort if she cannot get relief and heal the fissure with the medications. We also discussed that some people botox injections but I do not do this type of procedure.  - Follow up PRN and call with issues   All questions were answered to the satisfaction of the patient.  Virl Cagey 08/26/2018, 1:12 PM

## 2018-08-26 NOTE — Patient Instructions (Addendum)
Diltiazem (Cardizem) 2%/ Lidocaine 5% cream apply four times a day as needed for fissure- Called into [facility].   Keep stools soft and daily. Take fiber as needed and drink 64 ounces of water a day. Take stool softener as needed.   Anal Fissure, Adult  An anal fissure is a small tear or crack in the tissue of the anus. Bleeding from a fissure usually stops on its own within a few minutes. However, bleeding will often occur again with each bowel movement until the fissure heals. What are the causes? This condition is usually caused by passing a large or hard stool (feces). Other causes include:  Constipation.  Frequent diarrhea.  Inflammatory bowel disease (Crohn's disease or ulcerative colitis).  Childbirth.  Infections.  Anal sex. What are the signs or symptoms? Symptoms of this condition include:  Bleeding from the rectum.  Small amounts of blood seen on your stool, on the toilet paper, or in the toilet after a bowel movement. The blood coats the outside of the stool and is not mixed with the stool.  Painful bowel movements.  Itching or irritation around the anus. How is this diagnosed? A health care provider may diagnose this condition by closely examining the anal area. An anal fissure can usually be seen with careful inspection. In some cases, a rectal exam may be performed, or a short tube (anoscope) may be used to examine the anal canal. How is this treated? Initial treatment for this condition may include:  Taking steps to avoid constipation. This may include making changes to your diet, such as increasing your intake of fiber or fluid.  Taking fiber supplements. These supplements can soften your stool to help make bowel movements easier. Your health care provider may also prescribe a stool softener if your stool is hard.  Taking sitz baths. This may help to heal the tear.  Using medicated creams or ointments. These may be prescribed to lessen  discomfort. Treatments that are sometimes used if initial treatments do not work well or if the condition is more severe may include:  Botulinum injection.  Surgery to repair the fissure. Follow these instructions at home: Eating and drinking   Avoid foods that may cause constipation, such as bananas, milk, and other dairy products.  Eat all fruits, except bananas.  Drink enough fluid to keep your urine pale yellow.  Eat foods that are high in fiber, such as beans, whole grains, and fresh fruits and vegetables. General instructions   Take over-the-counter and prescription medicines only as told by your health care provider.  Use creams or ointments only as told by your health care provider.  Keep the anal area clean and dry.  Take sitz baths as told by your health care provider. Do not use soap in the sitz baths.  Keep all follow-up visits as told by your health care provider. This is important. Contact a health care provider if you have:  More bleeding.  A fever.  Diarrhea that is mixed with blood.  Pain that continues.  Ongoing problems that are getting worse rather than better. Summary  An anal fissure is a small tear or crack in the tissue of the anus. This condition is usually caused by passing a large or hard stool (feces). Other causes include constipation and frequent diarrhea.  Initial treatment for this condition may include taking steps to avoid constipation, such as increasing your intake of fiber or fluid.  Follow instructions for care as told by your health care  provider.  Contact your health care provider if you have more bleeding or your problem is getting worse rather than better.  Keep all follow-up visits as told by your health care provider. This is important. This information is not intended to replace advice given to you by your health care provider. Make sure you discuss any questions you have with your health care provider. Document  Released: 01/20/2005 Document Revised: 07/02/2017 Document Reviewed: 07/02/2017 Elsevier Patient Education  2020 [employer].   How to Take a [employer] A sitz bath is a warm water bath that may be used to care for your rectum, genital area, or the area between your rectum and genitals (perineum). For a sitz bath, the water only comes up to your hips and covers your buttocks. A sitz bath may done at home in a bathtub or with a portable sitz bath that fits over the toilet. Your health care provider may recommend a sitz bath to help:  Relieve pain and discomfort after delivering a baby.  Relieve pain and itching from hemorrhoids or anal fissures.  Relieve pain after certain surgeries.  Relax muscles that are sore or tight. How to take a sitz bath Take 3-4 sitz baths a day, or as many as told by your health care provider. Bathtub sitz bath To take a sitz bath in a bathtub: 1. Partially fill a bathtub with warm water. The water should be deep enough to cover your hips and buttocks when you are sitting in the tub. 2. If your health care provider told you to put medicine in the water, follow his or her instructions. 3. Sit in the water. 4. Open the tub drain a little, and leave it open during your bath. 5. Turn on the warm water again, enough to replace the water that is draining out. Keep the water running throughout your bath. This helps keep the water at the right level and the right temperature. 6. Soak in the water for 15-20 minutes, or as long as told by your health care provider. 7. When you are done, be careful when you stand up. You may feel dizzy. 8. After the sitz bath, pat yourself dry. Do not rub your skin to dry it.  Over-the-toilet sitz bath To take a sitz bath with an over-the-toilet basin: 1. Follow the manufacturer's instructions. 2. Fill the basin with warm water. 3. If your health care provider told you to put medicine in the water, follow his or her  instructions. 4. Sit on the seat. Make sure the water covers your buttocks and perineum. 5. Soak in the water for 15-20 minutes, or as long as told by your health care provider. 6. After the sitz bath, pat yourself dry. Do not rub your skin to dry it. 7. Clean and dry the basin between uses. 8. Discard the basin if it cracks, or according to the manufacturer's instructions. Contact a health care provider if:  Your symptoms get worse. Do not continue with sitz baths if your symptoms get worse.  You have new symptoms. If this happens, do not continue with sitz baths until you talk with your health care provider. Summary  A sitz bath is a warm water bath in which the water only comes up to your hips and covers your buttocks.  A sitz bath may help relieve itching, relieve pain, and relax muscles that are sore or tight in the lower part of your body, including your genital area.  Take 3-4 sitz baths a day,  or as many as told by your health care provider. Soak in the water for 15-20 minutes.  Do not continue with sitz baths if your symptoms get worse. This information is not intended to replace advice given to you by your health care provider. Make sure you discuss any questions you have with your health care provider. Document Released: 10/13/2003 Document Revised: 01/22/2017 Document Reviewed: 01/22/2017 Elsevier Patient Education  2020 [employer].

## 2019-01-03 ENCOUNTER — Telehealth: Payer: Self-pay

## 2019-01-03 NOTE — Telephone Encounter (Signed)
Patient called regarding anal fissure, states that it is bothering her again. She also says that she does NOT want surgery, but was asking about botox. MD notified.

## 2019-01-06 ENCOUNTER — Ambulatory Visit: Payer: 59 | Admitting: General Surgery

## 2019-01-24 ENCOUNTER — Ambulatory Visit: Payer: 59 | Attending: Internal Medicine

## 2019-01-24 DIAGNOSIS — Z20822 Contact with and (suspected) exposure to covid-19: Secondary | ICD-10-CM

## 2019-01-25 LAB — NOVEL CORONAVIRUS, NAA: SARS-CoV-2, NAA: NOT DETECTED

## 2019-02-03 ENCOUNTER — Telehealth: Payer: Self-pay | Admitting: Internal Medicine

## 2019-02-03 NOTE — Telephone Encounter (Signed)
Patient is calling to receive her negative COVID test. Patient expressed understanding. °

## 2019-06-20 ENCOUNTER — Other Ambulatory Visit (INDEPENDENT_AMBULATORY_CARE_PROVIDER_SITE_OTHER): Payer: Self-pay | Admitting: Internal Medicine

## 2019-06-20 DIAGNOSIS — K602 Anal fissure, unspecified: Secondary | ICD-10-CM

## 2019-06-20 MED ORDER — DILTIAZEM GEL 2 %
1.0000 | Freq: Two times a day (BID) | CUTANEOUS | 2 refills | Status: DC
Start: 2019-06-20 — End: 2019-08-26

## 2019-06-20 NOTE — Progress Notes (Signed)
Prescription for diltiazem gel sent to patient's pharmacy.

## 2019-08-26 ENCOUNTER — Inpatient Hospital Stay (HOSPITAL_COMMUNITY): Payer: 59

## 2019-08-26 ENCOUNTER — Other Ambulatory Visit: Payer: Self-pay

## 2019-08-26 ENCOUNTER — Inpatient Hospital Stay (HOSPITAL_COMMUNITY)
Admission: EM | Admit: 2019-08-26 | Discharge: 2019-08-28 | DRG: 193 | Disposition: A | Discharge status: Home / Self Care | Payer: 59 | Attending: Internal Medicine | Admitting: Internal Medicine

## 2019-08-26 ENCOUNTER — Encounter (HOSPITAL_COMMUNITY): Payer: Self-pay

## 2019-08-26 ENCOUNTER — Emergency Department (HOSPITAL_COMMUNITY): Payer: 59

## 2019-08-26 DIAGNOSIS — J189 Pneumonia, unspecified organism: Principal | ICD-10-CM

## 2019-08-26 DIAGNOSIS — Z7289 Other problems related to lifestyle: Secondary | ICD-10-CM | POA: Diagnosis not present

## 2019-08-26 DIAGNOSIS — G43909 Migraine, unspecified, not intractable, without status migrainosus: Secondary | ICD-10-CM | POA: Diagnosis not present

## 2019-08-26 DIAGNOSIS — Z713 Dietary counseling and surveillance: Secondary | ICD-10-CM

## 2019-08-26 DIAGNOSIS — Z79899 Other long term (current) drug therapy: Secondary | ICD-10-CM | POA: Diagnosis not present

## 2019-08-26 DIAGNOSIS — Z8669 Personal history of other diseases of the nervous system and sense organs: Secondary | ICD-10-CM | POA: Diagnosis not present

## 2019-08-26 DIAGNOSIS — K219 Gastro-esophageal reflux disease without esophagitis: Secondary | ICD-10-CM | POA: Diagnosis present

## 2019-08-26 DIAGNOSIS — Z20822 Contact with and (suspected) exposure to covid-19: Secondary | ICD-10-CM | POA: Diagnosis present

## 2019-08-26 DIAGNOSIS — F329 Major depressive disorder, single episode, unspecified: Secondary | ICD-10-CM

## 2019-08-26 DIAGNOSIS — F419 Anxiety disorder, unspecified: Secondary | ICD-10-CM | POA: Diagnosis present

## 2019-08-26 DIAGNOSIS — E669 Obesity, unspecified: Secondary | ICD-10-CM

## 2019-08-26 DIAGNOSIS — J9601 Acute respiratory failure with hypoxia: Secondary | ICD-10-CM

## 2019-08-26 DIAGNOSIS — Z9049 Acquired absence of other specified parts of digestive tract: Secondary | ICD-10-CM | POA: Diagnosis not present

## 2019-08-26 DIAGNOSIS — Z90721 Acquired absence of ovaries, unilateral: Secondary | ICD-10-CM

## 2019-08-26 DIAGNOSIS — Z9884 Bariatric surgery status: Secondary | ICD-10-CM

## 2019-08-26 DIAGNOSIS — F32A Depression, unspecified: Secondary | ICD-10-CM | POA: Diagnosis present

## 2019-08-26 DIAGNOSIS — Z90710 Acquired absence of both cervix and uterus: Secondary | ICD-10-CM | POA: Diagnosis not present

## 2019-08-26 DIAGNOSIS — Z6841 Body Mass Index (BMI) 40.0 and over, adult: Secondary | ICD-10-CM

## 2019-08-26 LAB — ECHOCARDIOGRAM COMPLETE
AR max vel: 2.73 cm2
AV Area VTI: 2.5 cm2
AV Area mean vel: 2.5 cm2
AV Mean grad: 3.2 mmHg
AV Peak grad: 4.9 mmHg
Ao pk vel: 1.1 m/s
Area-P 1/2: 3.66 cm2
Height: 66 in
S' Lateral: 2.79 cm
Weight: 4368 oz

## 2019-08-26 LAB — COMPREHENSIVE METABOLIC PANEL WITH GFR
ALT: 33 U/L (ref 0–44)
AST: 25 U/L (ref 15–41)
Albumin: 4.2 g/dL (ref 3.5–5.0)
Alkaline Phosphatase: 89 U/L (ref 38–126)
Anion gap: 10 (ref 5–15)
BUN: 19 mg/dL (ref 6–20)
CO2: 26 mmol/L (ref 22–32)
Calcium: 9.2 mg/dL (ref 8.9–10.3)
Chloride: 104 mmol/L (ref 98–111)
Creatinine, Ser: 0.74 mg/dL (ref 0.44–1.00)
GFR calc Af Amer: >60 mL/min
GFR calc non Af Amer: >60 mL/min
Glucose, Bld: 117 mg/dL — ABNORMAL HIGH (ref 70–99)
Potassium: 4 mmol/L (ref 3.5–5.1)
Sodium: 140 mmol/L (ref 135–145)
Total Bilirubin: 0.5 mg/dL (ref 0.3–1.2)
Total Protein: 7.7 g/dL (ref 6.5–8.1)

## 2019-08-26 LAB — CBC WITH DIFFERENTIAL/PLATELET
Abs Immature Granulocytes: 0.14 10*3/uL — ABNORMAL HIGH (ref 0.00–0.07)
Basophils Absolute: 0.1 10*3/uL (ref 0.0–0.1)
Basophils Relative: 1 %
Eosinophils Absolute: 0.3 10*3/uL (ref 0.0–0.5)
Eosinophils Relative: 3 %
HCT: 48.5 % — ABNORMAL HIGH (ref 36.0–46.0)
Hemoglobin: 15.6 g/dL — ABNORMAL HIGH (ref 12.0–15.0)
Immature Granulocytes: 1 %
Lymphocytes Relative: 25 %
Lymphs Abs: 2.9 10*3/uL (ref 0.7–4.0)
MCH: 32.6 pg (ref 26.0–34.0)
MCHC: 32.2 g/dL (ref 30.0–36.0)
MCV: 101.3 fL — ABNORMAL HIGH (ref 80.0–100.0)
Monocytes Absolute: 0.8 10*3/uL (ref 0.1–1.0)
Monocytes Relative: 7 %
Neutro Abs: 7.6 10*3/uL (ref 1.7–7.7)
Neutrophils Relative %: 63 %
Platelets: 419 10*3/uL — ABNORMAL HIGH (ref 150–400)
RBC: 4.79 MIL/uL (ref 3.87–5.11)
RDW: 12.7 % (ref 11.5–15.5)
WBC: 11.9 10*3/uL — ABNORMAL HIGH (ref 4.0–10.5)
nRBC: 0 % (ref 0.0–0.2)

## 2019-08-26 LAB — TSH: TSH: 1.748 u[IU]/mL (ref 0.350–4.500)

## 2019-08-26 LAB — BRAIN NATRIURETIC PEPTIDE: B Natriuretic Peptide: 48 pg/mL (ref 0.0–100.0)

## 2019-08-26 LAB — TROPONIN I (HIGH SENSITIVITY)
Troponin I (High Sensitivity): 3 ng/L (ref <18)
Troponin I (High Sensitivity): 2 ng/L (ref <18)

## 2019-08-26 LAB — HEMOGLOBIN A1C
Hgb A1c MFr Bld: 6.2 % — ABNORMAL HIGH (ref 4.8–5.6)
Mean Plasma Glucose: 131.24 mg/dL

## 2019-08-26 LAB — SARS CORONAVIRUS 2 BY RT PCR (HOSPITAL ORDER, PERFORMED IN ~~LOC~~ HOSPITAL LAB): SARS Coronavirus 2: NEGATIVE

## 2019-08-26 LAB — LACTIC ACID, PLASMA: Lactic Acid, Venous: 1.5 mmol/L (ref 0.5–1.9)

## 2019-08-26 MED ORDER — DM-GUAIFENESIN ER 30-600 MG PO TB12
1.0000 | ORAL_TABLET | Freq: Two times a day (BID) | ORAL | Status: DC
  Administered 2019-08-26 – 2019-08-28 (×5): 1 via ORAL
  Filled 2019-08-26 (×5): qty 1

## 2019-08-26 MED ORDER — ESCITALOPRAM OXALATE 10 MG PO TABS
10.0000 mg | ORAL_TABLET | Freq: Every day | ORAL | Status: DC
  Administered 2019-08-27 – 2019-08-28 (×2): 10 mg via ORAL
  Filled 2019-08-26 (×2): qty 1

## 2019-08-26 MED ORDER — ENOXAPARIN SODIUM 40 MG/0.4ML ~~LOC~~ SOLN
40.0000 mg | SUBCUTANEOUS | Status: DC
  Administered 2019-08-26 – 2019-08-28 (×3): 40 mg via SUBCUTANEOUS
  Filled 2019-08-26 (×3): qty 0.4

## 2019-08-26 MED ORDER — AZITHROMYCIN 250 MG PO TABS
500.0000 mg | ORAL_TABLET | Freq: Every day | ORAL | Status: DC
  Administered 2019-08-27 – 2019-08-28 (×2): 500 mg via ORAL
  Filled 2019-08-26 (×2): qty 2

## 2019-08-26 MED ORDER — ALPRAZOLAM 0.5 MG PO TABS
0.5000 mg | ORAL_TABLET | Freq: Two times a day (BID) | ORAL | Status: DC | PRN
  Administered 2019-08-26 – 2019-08-27 (×2): 0.5 mg via ORAL
  Filled 2019-08-26 (×2): qty 1

## 2019-08-26 MED ORDER — SODIUM CHLORIDE 0.9 % IV SOLN
1.0000 g | Freq: Once | INTRAVENOUS | Status: AC
  Administered 2019-08-26: 1 g via INTRAVENOUS
  Filled 2019-08-26: qty 10

## 2019-08-26 MED ORDER — PANTOPRAZOLE SODIUM 40 MG PO TBEC
40.0000 mg | DELAYED_RELEASE_TABLET | Freq: Every day | ORAL | Status: DC
  Administered 2019-08-27 – 2019-08-28 (×2): 40 mg via ORAL
  Filled 2019-08-26 (×2): qty 1

## 2019-08-26 MED ORDER — ALBUTEROL SULFATE HFA 108 (90 BASE) MCG/ACT IN AERS
2.0000 | INHALATION_SPRAY | Freq: Once | RESPIRATORY_TRACT | Status: AC
  Administered 2019-08-26: 2 via RESPIRATORY_TRACT
  Filled 2019-08-26: qty 6.7

## 2019-08-26 MED ORDER — FUROSEMIDE 10 MG/ML IJ SOLN
40.0000 mg | Freq: Two times a day (BID) | INTRAMUSCULAR | Status: DC
  Administered 2019-08-26: 40 mg via INTRAVENOUS
  Filled 2019-08-26: qty 4

## 2019-08-26 MED ORDER — AZITHROMYCIN 250 MG PO TABS
500.0000 mg | ORAL_TABLET | Freq: Once | ORAL | Status: AC
  Administered 2019-08-26: 500 mg via ORAL
  Filled 2019-08-26: qty 2

## 2019-08-26 MED ORDER — SODIUM CHLORIDE 0.9 % IV SOLN
1.0000 g | INTRAVENOUS | Status: DC
  Administered 2019-08-27 – 2019-08-28 (×2): 1 g via INTRAVENOUS
  Filled 2019-08-26 (×2): qty 10

## 2019-08-26 MED ORDER — IPRATROPIUM-ALBUTEROL 0.5-2.5 (3) MG/3ML IN SOLN
3.0000 mL | Freq: Four times a day (QID) | RESPIRATORY_TRACT | Status: DC | PRN
  Administered 2019-08-27 – 2019-08-28 (×2): 3 mL via RESPIRATORY_TRACT
  Filled 2019-08-26 (×2): qty 3

## 2019-08-26 MED ORDER — ACETAMINOPHEN 325 MG PO TABS
650.0000 mg | ORAL_TABLET | Freq: Four times a day (QID) | ORAL | Status: DC | PRN
  Administered 2019-08-26: 650 mg via ORAL
  Filled 2019-08-26 (×2): qty 2

## 2019-08-26 NOTE — ED Notes (Signed)
Report to [facility], Dept 300

## 2019-08-26 NOTE — ED Notes (Signed)
ED TO INPATIENT HANDOFF REPORT  ED Nurse Name and Phone #:  [phone]  S Name/Age/Gender Allison Watts 59 y.o. female Room/Bed: APA18/APA18  Code Status   Code Status: Prior  Home/SNF/Other Home Patient oriented to: self, place, time and situation Is this baseline? Yes   Triage Complete: Triage complete  Chief Complaint Acute respiratory failure with hypoxia ([place]) [J96.01]  Triage Note Pt began having a sore throat, congestion, and a cough 9 days ago. She has been  Taking Mucinex, Nyquil, and Tylenol. She has some SOB today and was 87% on room air.     Allergies No Known Allergies  Level of Care/Admitting Diagnosis ED Disposition    ED Disposition Condition [place] Area: [facility] [[id number]]  Level of Care: Telemetry [5]  Covid Evaluation: Confirmed COVID Negative  Diagnosis: Acute respiratory failure with hypoxia [facility]) [[id number]]  Admitting Physician: Barton Dubois [[id number]]  Attending Physician: Barton Dubois [[id number]]  Estimated length of stay: past midnight tomorrow  Certification:: I certify this patient will need inpatient services for at least 2 midnights       B Medical/Surgery History Past Medical History:  Diagnosis Date  . Anxiety   . Arthritis    in knees  . Depression   . GERD (gastroesophageal reflux disease)   . History of migraine    Past Surgical History:  Procedure Laterality Date  . ABDOMINAL EXPLORATION SURGERY     removed one ovary  . ABDOMINAL HYSTERECTOMY    . CHOLECYSTECTOMY    . COLONOSCOPY  09/27/2010   Procedure: COLONOSCOPY;  Surgeon: Rogene Houston, MD;  [facility];  Service: Endoscopy;  Laterality: N/A;  8:30  . ESOPHAGOGASTRODUODENOSCOPY  09/27/2010   Procedure: ESOPHAGOGASTRODUODENOSCOPY (EGD);  Surgeon: Rogene Houston, MD;  [facility];  Service: Endoscopy;  Laterality: N/A;  . GASTRIC BYPASS  2002  . INSERTION OF MESH N/A 10/23/2015   Procedure: INSERTION OF MESH;   Surgeon: Donnie Mesa, MD;  Location: [place];  Service: General;  Laterality: N/A;  . VENTRAL HERNIA REPAIR N/A 10/23/2015   Procedure: LAPAROSCOPIC VENTRAL HERNIA REPAIR WITH MESH;  Surgeon: Donnie Mesa, MD;  Location: [place];  Service: General;  Laterality: N/A;     A IV Location/Drains/Wounds Patient Lines/Drains/Airways Status    Active Line/Drains/Airways    Name Placement date Placement time Site Days   Peripheral IV 08/26/19 Anterior;Right Hand 08/26/19  1210  Hand  less than 1   Incision (Closed) 10/23/15 Abdomen Bilateral 10/23/15  1411   1403   Incision - 3 Ports Abdomen 1: Left;Lateral;Upper 2: Left;Lateral;Mid 3: Left;Lateral;Lower 10/23/15  1439   1403          Intake/Output Last 24 hours No intake or output data in the 24 hours ending 08/26/19 1501  Labs/Imaging Results for orders placed or performed during the hospital encounter of 08/26/19 (from the past 48 hour(s))  SARS Coronavirus 2 by RT PCR (hospital order, performed in [facility] hospital lab) Nasopharyngeal Nasopharyngeal Swab     Status: None   Collection Time: 08/26/19  9:20 AM   Specimen: Nasopharyngeal Swab  Result Value Ref Range   SARS Coronavirus 2 NEGATIVE NEGATIVE    Comment: (NOTE) SARS-CoV-2 target nucleic acids are NOT DETECTED.  The SARS-CoV-2 RNA is generally detectable in upper and lower respiratory specimens during the acute phase of infection. The lowest concentration of SARS-CoV-2 viral copies this assay can detect is 250 copies / mL. A negative  result does not preclude SARS-CoV-2 infection and should not be used as the sole basis for treatment or other patient management decisions.  A negative result may occur with improper specimen collection / handling, submission of specimen other than nasopharyngeal swab, presence of viral mutation(s) within the areas targeted by this assay, and inadequate number of viral copies (<250 copies / mL). A negative result must be combined with  clinical observations, patient history, and epidemiological information.  Fact Sheet for Patients:   StrictlyIdeas.no  Fact Sheet for Healthcare Providers: BankingDealers.co.za  This test is not yet approved or  cleared by the [place] FDA and has been authorized for detection and/or diagnosis of SARS-CoV-2 by FDA under an Emergency Use Authorization (EUA).  This EUA will remain in effect (meaning this test can be used) for the duration of the COVID-19 declaration under Section 564(b)(1) of the Act, 21 U.S.C. section 360bbb-3(b)(1), unless the authorization is terminated or revoked sooner.  Performed at [facility], [address] [place], [place] [address]   CBC with Differential/Platelet     Status: Abnormal   Collection Time: 08/26/19  9:51 AM  Result Value Ref Range   WBC 11.9 (H) 4.0 - 10.5 K/uL   RBC 4.79 3.87 - 5.11 MIL/uL   Hemoglobin 15.6 (H) 12.0 - 15.0 g/dL   HCT 48.5 (H) 36 - 46 %   MCV 101.3 (H) 80.0 - 100.0 fL   MCH 32.6 26.0 - 34.0 pg   MCHC 32.2 30.0 - 36.0 g/dL   RDW 12.7 11.5 - 15.5 %   Platelets 419 (H) 150 - 400 K/uL   nRBC 0.0 0.0 - 0.2 %   Neutrophils Relative % 63 %   Neutro Abs 7.6 1.7 - 7.7 K/uL   Lymphocytes Relative 25 %   Lymphs Abs 2.9 0.7 - 4.0 K/uL   Monocytes Relative 7 %   Monocytes Absolute 0.8 0 - 1 K/uL   Eosinophils Relative 3 %   Eosinophils Absolute 0.3 0 - 0 K/uL   Basophils Relative 1 %   Basophils Absolute 0.1 0 - 0 K/uL   Immature Granulocytes 1 %   Abs Immature Granulocytes 0.14 (H) 0.00 - 0.07 K/uL    Comment: Performed at [facility], [address] [place], [place] [address]  Comprehensive metabolic panel     Status: Abnormal   Collection Time: 08/26/19  9:51 AM  Result Value Ref Range   Sodium 140 135 - 145 mmol/L   Potassium 4.0 3.5 - 5.1 mmol/L   Chloride 104 98 - 111 mmol/L   CO2 26 22 - 32 mmol/L   Glucose, Bld 117 (H) 70 - 99 mg/dL    Comment: Glucose  reference range applies only to samples taken after fasting for at least 8 hours.   BUN 19 6 - 20 mg/dL   Creatinine, Ser 0.74 0.44 - 1.00 mg/dL   Calcium 9.2 8.9 - 10.3 mg/dL   Total Protein 7.7 6.5 - 8.1 g/dL   Albumin 4.2 3.5 - 5.0 g/dL   AST 25 15 - 41 U/L   ALT 33 0 - 44 U/L   Alkaline Phosphatase 89 38 - 126 U/L   Total Bilirubin 0.5 0.3 - 1.2 mg/dL   GFR calc non Af Amer >60 >60 mL/min   GFR calc Af Amer >60 >60 mL/min   Anion gap 10 5 - 15    Comment: Performed at [facility], [address], [place], [place] [address]  Brain natriuretic peptide  Status: None   Collection Time: 08/26/19  9:51 AM  Result Value Ref Range   B Natriuretic Peptide 48.0 0.0 - 100.0 pg/mL    Comment: Performed at [facility], [address], [place], [place] [address]  Troponin I (High Sensitivity)     Status: None   Collection Time: 08/26/19  9:51 AM  Result Value Ref Range   Troponin I (High Sensitivity) 3 <18 ng/L    Comment: (NOTE) Elevated high sensitivity troponin I (hsTnI) values and significant  changes across serial measurements may suggest ACS but many other  chronic and acute conditions are known to elevate hsTnI results.  Refer to the "Links" section for chest pain algorithms and additional  guidance. Performed at [facility], [address] [place], [place] [address]   TSH     Status: None   Collection Time: 08/26/19  9:51 AM  Result Value Ref Range   TSH 1.748 0.350 - 4.500 uIU/mL    Comment: Performed by a 3rd Generation assay with a functional sensitivity of <=0.01 uIU/mL. Performed at [facility], [address] [place], [place] [address]   Lactic acid, plasma     Status: None   Collection Time: 08/26/19  9:52 AM  Result Value Ref Range   Lactic Acid, Venous 1.5 0.5 - 1.9 mmol/L    Comment: Performed at [facility], [address] [place], [place] [address]  Troponin I (High Sensitivity)     Status: None   Collection Time: 08/26/19 12:17 PM  Result Value Ref Range    Troponin I (High Sensitivity) 2 <18 ng/L    Comment: (NOTE) Elevated high sensitivity troponin I (hsTnI) values and significant  changes across serial measurements may suggest ACS but many other  chronic and acute conditions are known to elevate hsTnI results.  Refer to the "Links" section for chest pain algorithms and additional  guidance. Performed at [facility], [address] [place], [place] [address]    DG Chest Port 1 View  Result Date: 08/26/2019 CLINICAL DATA:  Shortness of breath. EXAM: PORTABLE CHEST 1 VIEW COMPARISON:  Chest x-ray 02/11/2011. FINDINGS: Mediastinum hilar structures normal. Borderline cardiomegaly and pulmonary venous congestion. Bibasilar pulmonary infiltrates/edema. Small bilateral layering pleural effusions cannot be excluded. Left costophrenic angle incompletely imaged. No pneumothorax. IMPRESSION: Borderline cardiomegaly and pulmonary venous congestion. Bibasilar pulmonary infiltrate/edema. Small bilateral layering pleural effusions cannot be excluded. Electronically Signed   By: Marcello Moores  Register   On: 08/26/2019 09:52    Pending Labs Unresulted Labs (From admission, onward) Comment          Start     Ordered   08/26/19 1132  Hemoglobin A1c  Once,   STAT        08/26/19 1131          Vitals/Pain Today's Vitals   08/26/19 0911 08/26/19 1031 08/26/19 1130 08/26/19 1200  BP: (!) 157/85 111/70 (!) 134/114 119/76  Pulse: 75 76 75 66  Resp: 18 20 22 18   Temp: 98.6 F (37 C)     TempSrc: Oral     SpO2: 92% 90% 92% 94%  Weight:      Height:      PainSc:        Isolation Precautions No active isolations  Medications Medications  enoxaparin (LOVENOX) injection 40 mg (40 mg Subcutaneous Given 08/26/19 1211)  ipratropium-albuterol (DUONEB) 0.5-2.5 (3) MG/3ML nebulizer solution 3 mL (has no administration in time range)  furosemide (LASIX) injection 40 mg (40 mg Intravenous Given  08/26/19 1210)  dextromethorphan-guaiFENesin (MUCINEX DM) 30-600  MG per 12 hr tablet 1 tablet (1 tablet Oral Given 08/26/19 1210)  albuterol (VENTOLIN HFA) 108 (90 Base) MCG/ACT inhaler 2 puff (2 puffs Inhalation Given 08/26/19 0932)  cefTRIAXone (ROCEPHIN) 1 g in sodium chloride 0.9 % 100 mL IVPB (1 g Intravenous New Bag/Given 08/26/19 1211)  azithromycin (ZITHROMAX) tablet 500 mg (500 mg Oral Given 08/26/19 1210)    Mobility walks     Focused Assessments    R Recommendations: See Admitting Provider Note  Report given to:   Additional Notes:

## 2019-08-26 NOTE — ED Provider Notes (Signed)
[place] Provider Note   CSN: [id number] Arrival date & time: 08/26/19  5009     History Chief Complaint  Patient presents with  . Shortness of Breath    Allison Watts is a 59 y.o. female.  Patient complains of shortness of breath and cough.  Patient has had a sore throat and cough for over a week.  She has had yellow-green sputum production  No language interpreter was used.  Shortness of Breath Severity:  Moderate Onset quality:  Sudden Timing:  Constant Progression:  Worsening Chronicity:  New Context: activity   Relieved by:  Nothing Worsened by:  Nothing Ineffective treatments:  None tried Associated symptoms: no abdominal pain, no chest pain, no cough, no headaches and no rash        Past Medical History:  Diagnosis Date  . Anxiety   . Arthritis    in knees  . Depression   . GERD (gastroesophageal reflux disease)   . History of migraine     Patient Active Problem List   Diagnosis Date Noted  . Acute respiratory failure with hypoxia ([place]) 08/26/2019  . Ventral incisional hernia 10/23/2015    Past Surgical History:  Procedure Laterality Date  . ABDOMINAL EXPLORATION SURGERY     removed one ovary  . ABDOMINAL HYSTERECTOMY    . CHOLECYSTECTOMY    . COLONOSCOPY  09/27/2010   Procedure: COLONOSCOPY;  Surgeon: Rogene Houston, MD;  [facility];  Service: Endoscopy;  Laterality: N/A;  8:30  . ESOPHAGOGASTRODUODENOSCOPY  09/27/2010   Procedure: ESOPHAGOGASTRODUODENOSCOPY (EGD);  Surgeon: Rogene Houston, MD;  [facility];  Service: Endoscopy;  Laterality: N/A;  . GASTRIC BYPASS  2002  . INSERTION OF MESH N/A 10/23/2015   Procedure: INSERTION OF MESH;  Surgeon: Donnie Mesa, MD;  Location: [place];  Service: General;  Laterality: N/A;  . VENTRAL HERNIA REPAIR N/A 10/23/2015   Procedure: LAPAROSCOPIC VENTRAL HERNIA REPAIR WITH MESH;  Surgeon: Donnie Mesa, MD;  Location: [place];  Service: General;  Laterality:  N/A;     OB History   No obstetric history on file.     Family History  Problem Relation Age of Onset  . Breast cancer Mother   . Hypertension Mother     Social History   Tobacco Use  . Smoking status: Never Smoker  . Smokeless tobacco: Never Used  Substance Use Topics  . Alcohol use: Yes    Alcohol/week: 7.0 standard drinks    Types: 7 Glasses of wine per week  . Drug use: No    Home Medications Prior to Admission medications   Medication Sig Start Date End Date Taking? Authorizing Provider  acetaminophen (TYLENOL) 500 MG tablet Take 1,000 mg by mouth every 6 (six) hours as needed for mild pain.    [provider]  ALPRAZolam Duanne Moron) 0.5 MG tablet Take 0.25-0.5 mg by mouth daily as needed for anxiety (depends on anxiety level if takes 0.25-0.5).    [provider]  ALPRAZolam Duanne Moron) 0.5 MG tablet Take 0.5 mg by mouth at bedtime as needed for anxiety.    [provider]  Bisacodyl-PEG-KCl-NaBicar-NaCl (HALFLYTELY WITH FLAVOR PACKS) 5-210 MG-GM kit Take 1 kit by mouth once. Patient not taking: Reported on 08/26/2018 09/19/10   Butch Penny, NP  Cyanocobalamin (VITAMIN B12) 1000 MCG TBCR Take 1,000 mcg by mouth daily.    [provider]  diltiazem 2 % GEL Apply 1 application topically 2 (two) times daily.  06/20/19   Rehman, Mechele Dawley, MD  escitalopram (LEXAPRO) 10 MG tablet Take 10 mg by mouth daily.    [provider]  escitalopram (LEXAPRO) 10 MG tablet Take 10 mg by mouth daily.    [provider]  HYDROmorphone (DILAUDID) 2 MG tablet Take 1 tablet (2 mg total) by mouth every 4 (four) hours as needed for severe pain. Patient not taking: Reported on 08/26/2018 10/25/15   Donnie Mesa, MD  methocarbamol (ROBAXIN) 500 MG tablet Take 1 tablet (500 mg total) by mouth every 6 (six) hours as needed for muscle spasms. Patient not taking: Reported on 08/26/2018 10/25/15   Donnie Mesa, MD  pantoprazole (PROTONIX) 40 MG tablet  Take 40 mg by mouth daily.    [provider]    Allergies    No known allergies  Review of Systems   Review of Systems  Constitutional: Negative for appetite change and fatigue.  HENT: Negative for congestion, ear discharge and sinus pressure.   Eyes: Negative for discharge.  Respiratory: Positive for shortness of breath. Negative for cough.   Cardiovascular: Negative for chest pain.  Gastrointestinal: Negative for abdominal pain and diarrhea.  Genitourinary: Negative for frequency and hematuria.  Musculoskeletal: Negative for back pain.  Skin: Negative for rash.  Neurological: Negative for seizures and headaches.  Psychiatric/Behavioral: Negative for hallucinations.    Physical Exam Updated Vital Signs BP 111/70 (BP Location: Right Wrist)   Pulse 76   Temp 98.6 F (37 C) (Oral)   Resp 20   Ht '5\' 6"'  (1.676 m)   Wt (!) 123.8 kg   SpO2 90%   BMI 44.06 kg/m   Physical Exam Vitals reviewed.  Constitutional:      Appearance: She is well-developed.  HENT:     Head: Normocephalic.     Nose: Nose normal.  Eyes:     General: No scleral icterus.    Conjunctiva/sclera: Conjunctivae normal.  Neck:     Thyroid: No thyromegaly.  Cardiovascular:     Rate and Rhythm: Normal rate and regular rhythm.     Heart sounds: No murmur heard.  No friction rub. No gallop.   Pulmonary:     Breath sounds: No stridor. Wheezing present. No rales.  Chest:     Chest wall: No tenderness.  Abdominal:     General: There is no distension.     Tenderness: There is no abdominal tenderness. There is no rebound.  Musculoskeletal:        General: Normal range of motion.     Cervical back: Neck supple.  Lymphadenopathy:     Cervical: No cervical adenopathy.  Skin:    Findings: No erythema or rash.  Neurological:     Mental Status: She is oriented to person, place, and time.     Motor: No abnormal muscle tone.     Coordination: Coordination normal.  Psychiatric:        Behavior:  Behavior normal.     ED Results / Procedures / Treatments   Labs (all labs ordered are listed, but only abnormal results are displayed) Labs Reviewed  CBC WITH DIFFERENTIAL/PLATELET - Abnormal; Notable for the following components:      Result Value   WBC 11.9 (*)    Hemoglobin 15.6 (*)    HCT 48.5 (*)    MCV 101.3 (*)    Platelets 419 (*)    Abs Immature Granulocytes 0.14 (*)    All other components within normal limits  COMPREHENSIVE METABOLIC PANEL -  Abnormal; Notable for the following components:   Glucose, Bld 117 (*)    All other components within normal limits  SARS CORONAVIRUS 2 BY RT PCR (HOSPITAL ORDER, [place] [facility])  LACTIC ACID, PLASMA  BRAIN NATRIURETIC PEPTIDE  TSH  HEMOGLOBIN A1C  TROPONIN I (HIGH SENSITIVITY)  TROPONIN I (HIGH SENSITIVITY)    EKG None  Radiology DG Chest Port 1 View  Result Date: 08/26/2019 CLINICAL DATA:  Shortness of breath. EXAM: PORTABLE CHEST 1 VIEW COMPARISON:  Chest x-ray 02/11/2011. FINDINGS: Mediastinum hilar structures normal. Borderline cardiomegaly and pulmonary venous congestion. Bibasilar pulmonary infiltrates/edema. Small bilateral layering pleural effusions cannot be excluded. Left costophrenic angle incompletely imaged. No pneumothorax. IMPRESSION: Borderline cardiomegaly and pulmonary venous congestion. Bibasilar pulmonary infiltrate/edema. Small bilateral layering pleural effusions cannot be excluded. Electronically Signed   By: Marcello Moores  Register   On: 08/26/2019 09:52    Procedures Procedures (including critical care time)  Medications Ordered in ED Medications  cefTRIAXone (ROCEPHIN) 1 g in sodium chloride 0.9 % 100 mL IVPB (has no administration in time range)  azithromycin (ZITHROMAX) tablet 500 mg (has no administration in time range)  enoxaparin (LOVENOX) injection 40 mg (has no administration in time range)  ipratropium-albuterol (DUONEB) 0.5-2.5 (3) MG/3ML nebulizer solution 3 mL (has no  administration in time range)  furosemide (LASIX) injection 40 mg (has no administration in time range)  dextromethorphan-guaiFENesin (MUCINEX DM) 30-600 MG per 12 hr tablet 1 tablet (has no administration in time range)  albuterol (VENTOLIN HFA) 108 (90 Base) MCG/ACT inhaler 2 puff (2 puffs Inhalation Given 08/26/19 0932)    ED Course  I have reviewed the triage vital signs and the nursing notes.  Pertinent labs & imaging results that were available during my care of the patient were reviewed by me and considered in my medical decision making (see chart for details).    MDM Rules/Calculators/A&P                          Patient with hypoxia and yellow-green sputum production.  Patient has infiltrates bilaterally.  Possible community-acquired pneumonia she will be admitted to medicine        This patient presents to the ED for concern of shortness of breath this involves an extensive number of treatment options, and is a complaint that carries with it a high risk of complications and morbidity.  The differential diagnosis includes pneumonia PE MI   Lab Tests:   I Ordered, reviewed, and interpreted labs, which included CBC and chemistries which show mild elevated white count  Medicines ordered:   I ordered medication Rocephin and Zithromax for pneumonia  Imaging Studies ordered:   I ordered imaging studies which included chest x-ray and  I independently visualized and interpreted imaging which showed bilateral lower lobe infiltrates  Additional history obtained:   Additional history obtained from records  Previous records obtained and reviewed.  Consultations Obtained:   I consulted hospitalist and discussed lab and imaging findings  Reevaluation:  After the interventions stated above, I reevaluated the patient and found improved  Critical Interventions:  .   Final Clinical Impression(s) / ED Diagnoses Final diagnoses:  Community acquired pneumonia,  unspecified laterality    Rx / DC Orders ED Discharge Orders    None       Milton Ferguson, MD 08/26/19 1154

## 2019-08-26 NOTE — H&P (Signed)
History and Physical    Allison Watts UQJ:[id number] DOB: January 24, 1961 DOA: 08/26/2019  PCP: Asencion Noble, MD   Patient coming from: Home  I have personally briefly reviewed patient's old medical records in [place]  Chief Complaint: Shortness of breath and productive cough  HPI: Allison Watts is a 59 y.o. female with medical history significant of with past medical history significant for anxiety/depression, gastroesophageal reflux disease, history of migraine and obesity; presented to the hospital secondary to shortness of breath and productive cough.  Patient presented has been present for about a week and worsening.  Patient reports associated sore throat, chills and her coughing spells have become productive with a yellow-greenish..  No hemoptysis, no chest pain, no vomiting, no focal weakness, no dysuria, no abdominal pain, no melena, no hematochezia and no other complaints.  ED Course: Patient found to be hypoxic, with no elevation of WBCs and chest x-ray demonstrating multifocal pneumonia.  Given patient symptoms and a cURB 65 more than 2, TRH has been consulted to place patient in hospital for further evaluation and management.  Review of Systems: Patient reports some headache (which are not new for her), otherwise negative except as mentioned in HPI.   Past Medical History:  Diagnosis Date  . Anxiety   . Arthritis    in knees  . Depression   . GERD (gastroesophageal reflux disease)   . History of migraine     Past Surgical History:  Procedure Laterality Date  . ABDOMINAL EXPLORATION SURGERY     removed one ovary  . ABDOMINAL HYSTERECTOMY    . CHOLECYSTECTOMY    . COLONOSCOPY  09/27/2010   Procedure: COLONOSCOPY;  Surgeon: Rogene Houston, MD;  [facility];  Service: Endoscopy;  Laterality: N/A;  8:30  . ESOPHAGOGASTRODUODENOSCOPY  09/27/2010   Procedure: ESOPHAGOGASTRODUODENOSCOPY (EGD);  Surgeon: Rogene Houston, MD;  [facility];   Service: Endoscopy;  Laterality: N/A;  . GASTRIC BYPASS  2002  . INSERTION OF MESH N/A 10/23/2015   Procedure: INSERTION OF MESH;  Surgeon: Donnie Mesa, MD;  Location: [facility];  Service: General;  Laterality: N/A;  . VENTRAL HERNIA REPAIR N/A 10/23/2015   Procedure: LAPAROSCOPIC VENTRAL HERNIA REPAIR WITH MESH;  Surgeon: Donnie Mesa, MD;  Location: [facility];  Service: General;  Laterality: N/A;    Social History  reports that she has never smoked. She has never used smokeless tobacco. She reports current alcohol use of about 7.0 standard drinks of alcohol per week. She reports that she does not use drugs.  Allergies  Allergen Reactions  . No Known Allergies     Family History  Problem Relation Age of Onset  . Breast cancer Mother   . Hypertension Mother     Prior to Admission medications   Medication Sig Start Date End Date Taking? Authorizing Provider  acetaminophen (TYLENOL) 500 MG tablet Take 1,000 mg by mouth every 6 (six) hours as needed for mild pain.    [provider]  ALPRAZolam Duanne Moron) 0.5 MG tablet Take 0.25-0.5 mg by mouth daily as needed for anxiety (depends on anxiety level if takes 0.25-0.5).    [provider]  ALPRAZolam Duanne Moron) 0.5 MG tablet Take 0.5 mg by mouth at bedtime as needed for anxiety.    [provider]  Bisacodyl-PEG-KCl-NaBicar-NaCl (HALFLYTELY WITH FLAVOR PACKS) 5-210 MG-GM kit Take 1 kit by mouth once. Patient not taking: Reported on 08/26/2018 09/19/10   Butch Penny, NP  Cyanocobalamin (VITAMIN B12)  1000 MCG TBCR Take 1,000 mcg by mouth daily.    [provider]  diltiazem 2 % GEL Apply 1 application topically 2 (two) times daily. 06/20/19   Rehman, Mechele Dawley, MD  escitalopram (LEXAPRO) 10 MG tablet Take 10 mg by mouth daily.    [provider]  escitalopram (LEXAPRO) 10 MG tablet Take 10 mg by mouth daily.    [provider]  HYDROmorphone (DILAUDID) 2 MG tablet Take 1 tablet (2 mg total) by mouth  every 4 (four) hours as needed for severe pain. Patient not taking: Reported on 08/26/2018 10/25/15   Donnie Mesa, MD  methocarbamol (ROBAXIN) 500 MG tablet Take 1 tablet (500 mg total) by mouth every 6 (six) hours as needed for muscle spasms. Patient not taking: Reported on 08/26/2018 10/25/15   Donnie Mesa, MD  pantoprazole (PROTONIX) 40 MG tablet Take 40 mg by mouth daily.    [provider]    Physical Exam: Vitals:   08/26/19 0910 08/26/19 0911 08/26/19 1031  BP:  (!) 157/85 111/70  Pulse:  75 76  Resp:  18 20  Temp:  98.6 F (37 C)   TempSrc:  Oral   SpO2:  92% 90%  Weight: (!) 123.8 kg    Height: '5\' 6"'  (1.676 m)      Constitutional: In no major distress; no using accessory muscles.  Expressing feeling short of breath with minimal exertion, requiring 2 L oxygen supplementation and experiencing intermittent episodes of productive cough.  Afebrile currently. Vitals:   08/26/19 0910 08/26/19 0911 08/26/19 1031  BP:  (!) 157/85 111/70  Pulse:  75 76  Resp:  18 20  Temp:  98.6 F (37 C)   TempSrc:  Oral   SpO2:  92% 90%  Weight: (!) 123.8 kg    Height: '5\' 6"'  (1.676 m)     Eyes: PERRL, lids and conjunctivae normal, no icterus, no nystagmus. ENMT: Mucous membranes are moist. Posterior pharynx clear of any exudate or lesions.  Neck: normal, supple, no masses, no thyromegaly, no JVD. Respiratory: Diffuse rhonchi bilaterally, positive tachypnea while engaging in exertion, reports feeling short winded.  2 L oxygen supplementation in place.  No crackles on exam. Cardiovascular: Regular rate and rhythm, no murmurs / rubs / gallops.  Unable to properly assess JVD with body habitus. Abdomen: Obese, no tenderness, no masses palpated. No hepatosplenomegaly. Bowel sounds positive.  Musculoskeletal: No clubbing / no cyanosis. No joint deformity upper and lower extremities. Good ROM, no contractures. Normal muscle tone.  Skin: no rashes, no petechiae.  Neurologic: CN 2-12  grossly intact. Sensation intact, DTR normal. Strength 5/5 in all 4.  Psychiatric: Normal judgment and insight. Alert and oriented x 3. Normal mood.    Labs on Admission: I have personally reviewed following labs and imaging studies  CBC: Recent Labs  Lab 08/26/19 0951  WBC 11.9*  NEUTROABS 7.6  HGB 15.6*  HCT 48.5*  MCV 101.3*  PLT 419*    Basic Metabolic Panel: Recent Labs  Lab 08/26/19 0951  NA 140  K 4.0  CL 104  CO2 26  GLUCOSE 117*  BUN 19  CREATININE 0.74  CALCIUM 9.2    GFR: Estimated Creatinine Clearance: 104.2 mL/min (by C-G formula based on SCr of 0.74 mg/dL).  Liver Function Tests: Recent Labs  Lab 08/26/19 0951  AST 25  ALT 33  ALKPHOS 89  BILITOT 0.5  PROT 7.7  ALBUMIN 4.2    Radiological Exams on Admission: DG Chest Port 1  View  Result Date: 08/26/2019 CLINICAL DATA:  Shortness of breath. EXAM: PORTABLE CHEST 1 VIEW COMPARISON:  Chest x-ray 02/11/2011. FINDINGS: Mediastinum hilar structures normal. Borderline cardiomegaly and pulmonary venous congestion. Bibasilar pulmonary infiltrates/edema. Small bilateral layering pleural effusions cannot be excluded. Left costophrenic angle incompletely imaged. No pneumothorax. IMPRESSION: Borderline cardiomegaly and pulmonary venous congestion. Bibasilar pulmonary infiltrate/edema. Small bilateral layering pleural effusions cannot be excluded. Electronically Signed   By: Marcello Moores  Register   On: 08/26/2019 09:52    EKG: Independently reviewed.  No acute ischemic changes.  Assessment/Plan 1-Acute respiratory failure with hypoxia (HCC) -In the setting of community-acquired pneumonia -Patient curb 65 more than 2; positive tachypnea, hypoxia and elevated WBCs. -Empirically started on Rocephin and Zithromax -Follow cultures results: Including blood cultures, urine culture, Legionella and strep pneumoniae antigen. -As needed DuoNeb -Incentive spirometer/flutter valve. -Started on Mucinex. -Follow clinical  response and titrate oxygen to room air as tolerated. -During evaluation of her images concern for vascular congestion was raised; x1 dose of Lasix will be given and will check a 2D echo.  No prior history of heart failure has been reported.  2-GERD (gastroesophageal reflux disease) -Continue PPI.  3-Depression/Anxiety -Stable mood. -No suicidal ideation or hallucination -Continue home antidepressant/anxiolytic regimen.  4-Hx of migraines -Continue as needed Tylenol  5-morbid obesity -Body mass index is 44.06 kg/m. -Low calorie diet, portion control increase physical activity has been discussed with patient.   DVT prophylaxis: Lovenox Code Status:   Full code Family Communication:  No family at bedside. Disposition Plan:   Patient is from:  Home  Anticipated DC to:  Home  Anticipated DC date:  08/28/19  Anticipated DC barriers: Stabilization of respiratory status and hopefully ability to completely wean off oxygen supplementation. Consults called:  None  Admission status:  Inpatient, length of stay more than 2 midnights; telemetry bed.  Severity of Illness: Moderate severity, patient with community-acquired pneumonia and concerns of vascular congestion on her chest x-ray.  She is having elevated WBCs and hypoxia on presentation.  Will be admitted to the hospital for IV antibiotics, oxygen supplementation, supportive care and closely follow culture results with decision for further intervention based on her clinical course.   Barton Dubois MD [facility]  How to contact the [facility] Attending or Consulting provider [place] or covering provider during after hours [place], for this patient?   1. Check the care team in [facility] and look for a) attending/consulting [facility] provider listed and b) the [facility] team listed 2. Log into [email or web address] and use 's universal password to access. If you do not have the password, please contact the hospital operator. 3. Locate the [facility] provider  you are looking for under [facility] and page to a number that you can be directly reached. 4. If you still have difficulty reaching the provider, please page the [facility] (Director on Call) for the Hospitalists listed on amion for assistance.  08/26/2019, 11:31 AM

## 2019-08-26 NOTE — Plan of Care (Signed)

## 2019-08-26 NOTE — Plan of Care (Signed)
  Problem: Elimination: Goal: Will not experience complications related to bowel motility Outcome: Progressing Goal: Will not experience complications related to urinary retention Outcome: Progressing   Problem: Safety: Goal: Ability to remain free from injury will improve Outcome: Progressing   Problem: Skin Integrity: Goal: Risk for impaired skin integrity will decrease Outcome: Progressing   

## 2019-08-26 NOTE — Progress Notes (Signed)
*  PRELIMINARY RESULTS* Echocardiogram 2D Echocardiogram has been performed.  Allison Watts 08/26/2019, 4:09 PM

## 2019-08-26 NOTE — ED Triage Notes (Signed)
Pt began having a sore throat, congestion, and a cough 9 days ago. She has been  Taking Mucinex, Nyquil, and Tylenol. She has some SOB today and was 87% on room air.

## 2019-08-27 LAB — BASIC METABOLIC PANEL
Anion gap: 12 (ref 5–15)
BUN: 20 mg/dL (ref 6–20)
CO2: 28 mmol/L (ref 22–32)
Calcium: 9.3 mg/dL (ref 8.9–10.3)
Chloride: 101 mmol/L (ref 98–111)
Creatinine, Ser: 0.63 mg/dL (ref 0.44–1.00)
GFR calc Af Amer: >60 mL/min (ref >60)
GFR calc non Af Amer: >60 mL/min (ref >60)
Glucose, Bld: 112 mg/dL — ABNORMAL HIGH (ref 70–99)
Potassium: 4.2 mmol/L (ref 3.5–5.1)
Sodium: 141 mmol/L (ref 135–145)

## 2019-08-27 LAB — CBC
HCT: 46.4 % — ABNORMAL HIGH (ref 36.0–46.0)
Hemoglobin: 14.7 g/dL (ref 12.0–15.0)
MCH: 32.4 pg (ref 26.0–34.0)
MCHC: 31.7 g/dL (ref 30.0–36.0)
MCV: 102.2 fL — ABNORMAL HIGH (ref 80.0–100.0)
Platelets: 414 10*3/uL — ABNORMAL HIGH (ref 150–400)
RBC: 4.54 MIL/uL (ref 3.87–5.11)
RDW: 12.9 % (ref 11.5–15.5)
WBC: 12 10*3/uL — ABNORMAL HIGH (ref 4.0–10.5)
nRBC: 0 % (ref 0.0–0.2)

## 2019-08-27 LAB — HIV ANTIBODY (ROUTINE TESTING W REFLEX): HIV Screen 4th Generation wRfx: NONREACTIVE

## 2019-08-27 MED ORDER — BUDESONIDE 0.5 MG/2ML IN SUSP
0.5000 mg | Freq: Two times a day (BID) | RESPIRATORY_TRACT | Status: DC
  Administered 2019-08-27 – 2019-08-28 (×2): 0.5 mg via RESPIRATORY_TRACT
  Filled 2019-08-27 (×2): qty 2

## 2019-08-27 NOTE — Progress Notes (Signed)
Patient refusing cardiac monitoring. [facility] made aware.

## 2019-08-27 NOTE — Progress Notes (Signed)
PROGRESS NOTE    [name]  BVQ:[id number] DOB: 11/25/1960 DOA: 08/26/2019 PCP: Asencion Noble, MD   Chief Complaint  Patient presents with   Shortness of Breath    Brief Narrative:  Allison Watts is a 59 y.o. female with medical history significant of with past medical history significant for anxiety/depression, gastroesophageal reflux disease, history of migraine and obesity; presented to the hospital secondary to shortness of breath and productive cough.  Patient presented has been present for about a week and worsening.  Patient reports associated sore throat, chills and her coughing spells have become productive with a yellow-greenish..  No hemoptysis, no chest pain, no vomiting, no focal weakness, no dysuria, no abdominal pain, no melena, no hematochezia and no other complaints.  ED Course: Patient found to be hypoxic, with no elevation of WBCs and chest x-ray demonstrating multifocal pneumonia.  Given patient symptoms and a cURB 65 more than 2, TRH has been consulted to place patient in hospital for further evaluation and management.  Assessment & Plan: 1-acute respiratory failure with hypoxia: In the setting of community-acquired pneumonia.  -Continue IV antibiotics -Wean oxygen supplementation as tolerated -Continue antitussive/mucolytic medication -Start the use of Pulmicort -Patient is still demonstrating desaturation with exertion, ongoing productive cough and shortness of breath. -No fever and overall feeling better. -2D echo demonstrating no component of heart failure.  Normal BNP.  2-gastroesophageal for disease -Continue PPI.  3-depression/anxiety -A stable mood -No suicidal ideation or hallucination -Continue home antidepressant/anxiolytic regimen.  4-history of migraine -Continue as needed Tylenol -Patient reports intermittent headaches currently mainly triggered by ongoing coughing.  5-morbid obesity Body mass index is 43.84 kg/m. -Low calorie  diet, portion control increase physical activity discussed with patient.   DVT prophylaxis: lovenox Code Status: Full code  Family Communication:  Husband at bedside.  Disposition:   Status is: Inpatient  Dispo: The patient is from: home              Anticipated d/c is to: home               Anticipated d/c date is: 7/25              Patient currently not medically stable for discharge due to ongoing desaturation on exertion, SOB and productive cough. Will continue IV antibiotics, start pulmicort and continue weaning oxygen supplementation.    Consultants:   None    Procedures:  See below for x-ray reports.    Antimicrobials:  Rocephin and Zithromax  08/26/19   Subjective: Desaturation on exertion appreciated (O2 down to 87%), no CP, no nausea, no fever. Reports breathing is better and is feeling better overall. Patient with productive coughing spells.  Objective: Vitals:   08/26/19 2020 08/27/19 0048 08/27/19 0444 08/27/19 0855  BP:  126/82 125/75 (!) 130/66  Pulse:  80 74 84  Resp:  18 20 19   Temp:  98.2 F (36.8 C) 98.2 F (36.8 C) 98 F (36.7 C)  TempSrc:  Oral Oral Oral  SpO2: 95% 92% 92% 96%  Weight:   (!) 123.2 kg   Height:        Intake/Output Summary (Last 24 hours) at 08/27/2019 1308 Last data filed at 08/27/2019 0900 Gross per 24 hour  Intake 480 ml  Output 750 ml  Net -270 ml   Filed Weights   08/26/19 0910 08/27/19 0444  Weight: (!) 123.8 kg (!) 123.2 kg    Examination: General exam: Appears calm and feeling better; still with intermittent  productive cough. No fever and still requiring oxygen supplementation with exertion.  Respiratory system: positive rhonchi bilaterally and exp wheezing, no using accessory muscles.  Cardiovascular system: S1 & S2 heard, RRR. No JVD, murmurs, rubs, gallops or clicks. No pedal edema. Gastrointestinal system: Abdomen is obese, nondistended, soft and nontender. No organomegaly or masses felt. Normal bowel sounds  heard. Central nervous system: Alert and oriented. No focal neurological deficits. Extremities: no cyanosis, no clubbing. Skin: No rashes, no petechiae.  Psychiatry: Judgement and insight appear normal. Mood & affect appropriate.    Data Reviewed: I have personally reviewed following labs and imaging studies  CBC: Recent Labs  Lab 08/26/19 0951 08/27/19 0627  WBC 11.9* 12.0*  NEUTROABS 7.6  --   HGB 15.6* 14.7  HCT 48.5* 46.4*  MCV 101.3* 102.2*  PLT 419* 414*    Basic Metabolic Panel: Recent Labs  Lab 08/26/19 0951 08/27/19 0627  NA 140 141  K 4.0 4.2  CL 104 101  CO2 26 28  GLUCOSE 117* 112*  BUN 19 20  CREATININE 0.74 0.63  CALCIUM 9.2 9.3    GFR: Estimated Creatinine Clearance: 104 mL/min (by C-G formula based on SCr of 0.63 mg/dL).  Liver Function Tests: Recent Labs  Lab 08/26/19 0951  AST 25  ALT 33  ALKPHOS 89  BILITOT 0.5  PROT 7.7  ALBUMIN 4.2    CBG: No results for input(s): GLUCAP in the last 168 hours.   Recent Results (from the past 240 hour(s))  SARS Coronavirus 2 by RT PCR (hospital order, performed in [facility] hospital lab) Nasopharyngeal Nasopharyngeal Swab     Status: None   Collection Time: 08/26/19  9:20 AM   Specimen: Nasopharyngeal Swab  Result Value Ref Range Status   SARS Coronavirus 2 NEGATIVE NEGATIVE Final    Comment: (NOTE) SARS-CoV-2 target nucleic acids are NOT DETECTED.  The SARS-CoV-2 RNA is generally detectable in upper and lower respiratory specimens during the acute phase of infection. The lowest concentration of SARS-CoV-2 viral copies this assay can detect is 250 copies / mL. A negative result does not preclude SARS-CoV-2 infection and should not be used as the sole basis for treatment or other patient management decisions.  A negative result may occur with improper specimen collection / handling, submission of specimen other than nasopharyngeal swab, presence of viral mutation(s) within the areas  targeted by this assay, and inadequate number of viral copies (<250 copies / mL). A negative result must be combined with clinical observations, patient history, and epidemiological information.  Fact Sheet for Patients:   StrictlyIdeas.no  Fact Sheet for Healthcare Providers: BankingDealers.co.za  This test is not yet approved or  cleared by the [place] FDA and has been authorized for detection and/or diagnosis of SARS-CoV-2 by FDA under an Emergency Use Authorization (EUA).  This EUA will remain in effect (meaning this test can be used) for the duration of the COVID-19 declaration under Section 564(b)(1) of the Act, 21 U.S.C. section 360bbb-3(b)(1), unless the authorization is terminated or revoked sooner.  Performed at [facility], [address], [place], [place] [address]      Radiology Studies: [facility] Chest [facility] 1 View  Result Date: 08/26/2019 CLINICAL DATA:  Shortness of breath. EXAM: PORTABLE CHEST 1 VIEW COMPARISON:  Chest x-ray 02/11/2011. FINDINGS: Mediastinum hilar structures normal. Borderline cardiomegaly and pulmonary venous congestion. Bibasilar pulmonary infiltrates/edema. Small bilateral layering pleural effusions cannot be excluded. Left costophrenic angle incompletely imaged. No pneumothorax. IMPRESSION: Borderline cardiomegaly and pulmonary venous congestion.  Bibasilar pulmonary infiltrate/edema. Small bilateral layering pleural effusions cannot be excluded. Electronically Signed   By: Marcello Moores  Register   On: 08/26/2019 09:52   ECHOCARDIOGRAM COMPLETE  Result Date: 08/26/2019    ECHOCARDIOGRAM REPORT   Patient Name:   Lashonta A Skop Date of Exam: 08/26/2019 Medical Rec #:  [id number]        Height:       66.0 in Accession #:    [id number]       Weight:       273.0 lb Date of Birth:  1960/09/09       BSA:          2.283 m Patient Age:    58 years         BP:           119/76 mmHg Patient Gender: F                HR:            66 bpm. Exam Location:  [facility] Procedure: 2D Echo, Cardiac Doppler and Color Doppler Indications:    Acute Respiratory Insufficiency 518.82 / R06.89  History:        Patient has no prior history of Echocardiogram examinations.                 GERD, Obesity.  Sonographer:    Alvino Chapel RCS Referring Phys: [place]  1. Left ventricular ejection fraction, by estimation, is 65 to 70%. The left ventricle has normal function. The left ventricle has no regional wall motion abnormalities. Left ventricular diastolic parameters were normal.  2. Right ventricular systolic function is normal. The right ventricular size is normal.  3. The mitral valve is normal in structure. No evidence of mitral valve regurgitation. No evidence of mitral stenosis.  4. The aortic valve has an indeterminant number of cusps. Aortic valve regurgitation is not visualized. No aortic stenosis is present.  5. The inferior vena cava is normal in size with greater than 50% respiratory variability, suggesting right atrial pressure of 3 mmHg. FINDINGS  Left Ventricle: Left ventricular ejection fraction, by estimation, is 65 to 70%. The left ventricle has normal function. The left ventricle has no regional wall motion abnormalities. The left ventricular internal cavity size was normal in size. There is  no left ventricular hypertrophy. Left ventricular diastolic parameters were normal. Right Ventricle: The right ventricular size is normal. No increase in right ventricular wall thickness. Right ventricular systolic function is normal. Left Atrium: Left atrial size was normal in size. Right Atrium: Right atrial size was normal in size. Pericardium: There is no evidence of pericardial effusion. Mitral Valve: The mitral valve is normal in structure. No evidence of mitral valve regurgitation. No evidence of mitral valve stenosis. Tricuspid Valve: The tricuspid valve is normal in structure. Tricuspid valve regurgitation is not  demonstrated. No evidence of tricuspid stenosis. Aortic Valve: The aortic valve has an indeterminant number of cusps. Aortic valve regurgitation is not visualized. No aortic stenosis is present. Aortic valve mean gradient measures 3.2 mmHg. Aortic valve peak gradient measures 4.9 mmHg. Aortic valve area, by VTI measures 2.50 cm. Pulmonic Valve: The pulmonic valve was not well visualized. Pulmonic valve regurgitation is not visualized. No evidence of pulmonic stenosis. Aorta: The aortic root is normal in size and structure. Venous: The inferior vena cava is normal in size with greater than 50% respiratory variability, suggesting right atrial pressure of 3 mmHg. IAS/Shunts: No atrial level  shunt detected by color flow Doppler.  LEFT VENTRICLE PLAX 2D LVIDd:         5.12 cm  Diastology LVIDs:         2.79 cm  LV e' lateral:   8.16 cm/s LV PW:         0.97 cm  LV E/e' lateral: 9.7 LV IVS:        0.93 cm  LV e' medial:    8.38 cm/s LVOT diam:     2.00 cm  LV E/e' medial:  9.4 LV SV:         65 LV SV Index:   28 LVOT Area:     3.14 cm  RIGHT VENTRICLE RV S prime:     12.50 cm/s TAPSE (M-mode): 2.3 cm LEFT ATRIUM             Index       RIGHT ATRIUM           Index LA diam:        3.70 cm 1.62 cm/m  RA Area:     17.20 cm LA Vol (A2C):   50.0 ml 21.91 ml/m RA Volume:   46.70 ml  20.46 ml/m LA Vol (A4C):   41.7 ml 18.27 ml/m LA Biplane Vol: 46.6 ml 20.42 ml/m  AORTIC VALVE AV Area (Vmax):    2.73 cm AV Area (Vmean):   2.50 cm AV Area (VTI):     2.50 cm AV Vmax:           110.33 cm/s AV Vmean:          86.432 cm/s AV VTI:            0.259 m AV Peak Grad:      4.9 mmHg AV Mean Grad:      3.2 mmHg LVOT Vmax:         95.90 cm/s LVOT Vmean:        68.900 cm/s LVOT VTI:          0.206 m LVOT/AV VTI ratio: 0.80  AORTA Ao Root diam: 2.80 cm MITRAL VALVE MV Area (PHT): 3.66 cm    SHUNTS MV Decel Time: 207 msec    Systemic VTI:  0.21 m MV E velocity: 79.00 cm/s  Systemic Diam: 2.00 cm MV A velocity: 79.00 cm/s MV E/A  ratio:  1.00 Carlyle Dolly MD Electronically signed by Carlyle Dolly MD Signature Date/Time: 08/26/2019/4:23:57 PM    Final     Scheduled Meds:  azithromycin  500 mg Oral Daily   budesonide (PULMICORT) nebulizer solution  0.5 mg Nebulization BID   dextromethorphan-guaiFENesin  1 tablet Oral BID   enoxaparin (LOVENOX) injection  40 mg Subcutaneous Q24H   escitalopram  10 mg Oral Daily   pantoprazole  40 mg Oral Daily   Continuous Infusions:  cefTRIAXone (ROCEPHIN)  IV 1 g (08/27/19 1306)     LOS: 1 day    Time spent: 30 minutes   Barton Dubois, MD [facility]   To contact the attending provider between 7A-7P or the covering provider during after hours 7P-7A, please log into the web site [email or web address] and access using universal [place] password for that web site. If you do not have the password, please call the hospital operator.  08/27/2019, 1:08 PM

## 2019-08-28 DIAGNOSIS — J189 Pneumonia, unspecified organism: Secondary | ICD-10-CM

## 2019-08-28 LAB — STREP PNEUMONIAE URINARY ANTIGEN: Strep Pneumo Urinary Antigen: NEGATIVE

## 2019-08-28 LAB — BASIC METABOLIC PANEL
Anion gap: 10 (ref 5–15)
BUN: 20 mg/dL (ref 6–20)
CO2: 28 mmol/L (ref 22–32)
Calcium: 9.2 mg/dL (ref 8.9–10.3)
Chloride: 102 mmol/L (ref 98–111)
Creatinine, Ser: 0.69 mg/dL (ref 0.44–1.00)
GFR calc Af Amer: >60 mL/min (ref >60)
GFR calc non Af Amer: >60 mL/min (ref >60)
Glucose, Bld: 110 mg/dL — ABNORMAL HIGH (ref 70–99)
Potassium: 4.4 mmol/L (ref 3.5–5.1)
Sodium: 140 mmol/L (ref 135–145)

## 2019-08-28 MED ORDER — CEFDINIR 300 MG PO CAPS
300.0000 mg | ORAL_CAPSULE | Freq: Two times a day (BID) | ORAL | 0 refills | Status: AC
Start: 2019-08-28 — End: 2019-09-02

## 2019-08-28 MED ORDER — ALBUTEROL SULFATE HFA 108 (90 BASE) MCG/ACT IN AERS
2.0000 | INHALATION_SPRAY | Freq: Four times a day (QID) | RESPIRATORY_TRACT | 0 refills | Status: AC | PRN
Start: 2019-08-28 — End: ?

## 2019-08-28 MED ORDER — DM-GUAIFENESIN ER 30-600 MG PO TB12: 1.0000 | ORAL_TABLET | Freq: Two times a day (BID) | ORAL | 0 refills | Status: DC

## 2019-08-28 MED ORDER — SUMATRIPTAN SUCCINATE 50 MG PO TABS
50.0000 mg | ORAL_TABLET | Freq: Once | ORAL | Status: AC
  Administered 2019-08-28: 50 mg via ORAL
  Filled 2019-08-28: qty 1

## 2019-08-28 NOTE — Discharge Summary (Signed)
Physician Discharge Summary  Allison Watts ZTI:[id number] DOB: 03-14-1960 DOA: 08/26/2019  PCP: Asencion Noble, MD  Admit date: 08/26/2019 Discharge date: 08/28/2019  Time spent: 35 minutes  Recommendations for Outpatient Follow-up:  1. Repeat basic metabolic panel to evaluate lites and renal function 2. Repeat chest x-ray in 4-6 weeks to assure complete resolution of infiltrate 3. Please assess patient pursuing evaluation for sleep apnea/obesity hypoventilation syndrome. 4. Continue helping/assisting with weight loss and if needed referral to bariatric clinic.   Discharge Diagnoses:  Principal Problem:   Acute respiratory failure with hypoxia (HCC) Active Problems:   GERD (gastroesophageal reflux disease)   Depression   Anxiety   Hx of migraines   Community acquired pneumonia Morbid obesity  Discharge Condition: Stable and improved.  Discharged home with instruction to follow-up with PCP in 10 days.  CODE STATUS: Full code.  Diet recommendation: Low calorie diet  Filed Weights   08/26/19 0910 08/27/19 0444 08/28/19 0500  Weight: (!) 123.8 kg (!) 123.2 kg (!) 124.1 kg    History of present illness:  Allison A Wrightis a 59 y.o.femalewith medical history significant ofwith past medical history significant for anxiety/depression, gastroesophageal reflux disease, history of migraine and obesity;presented to the hospital secondary to shortness of breath and productive cough. Patient presented has been present for about a week and worsening. Patient reports associated sore throat, chills and her coughing spells have become productive with a yellow-greenish.. No hemoptysis, no chest pain, no vomiting, no focal weakness, no dysuria, no abdominal pain, no melena, no hematochezia andno other complaints.  ED Course:Patient found to be hypoxic, with no elevation of WBCs and chest x-ray demonstrating multifocal pneumonia. Given patient symptoms and a cURB65 more than 2,TRH has  been consulted to place patient in hospital for further evaluation and management.  Hospital Course:  1-acute respiratory failure with hypoxia: In the setting of community-acquired pneumonia.  -Improved and is stable to complete treatment for pneumonia as an outpatient. -At discharge demonstrating no need for oxygen supplementation. -Patient will be discharged on cefdinir 300 mg by mouth twice a day to complete therapy; also continue the use of flutter valve, mucolytic's and as needed albuterol inhaler. -Outpatient follow-up with PCP has been recommended and will request repeat chest x-ray in 4-6 weeks to assure complete resolution of infiltrate. -2D echo demonstrating no component of heart failure.  Normal BNP.  2-gastroesophageal for disease -Continue PPI.  3-depression/anxiety -stable mood -No suicidal ideation or hallucination -Continue home antidepressant/anxiolytic regimen.  4-history of migraine -Continue as needed Tylenol -Patient reports intermittent headaches and the use of Imitrex sometimes.  5-morbid obesity -Body mass index is 43.84 kg/m. -Low calorie diet, portion control increase physical activity discussed with patient. -Patient will benefit of aggressive weight loss and evaluation for his sleep apnea as an outpatient. -also with concerns for underlying component of obesity hypoventilation syndrome.  Procedures: See below for x-ray reports  Consultations:  None  Discharge Exam: Vitals:   08/28/19 0731 08/28/19 0736  BP:    Pulse:    Resp:    Temp:    SpO2: 93% 93%    General: No fever, no chest pain, no nausea or vomiting.  Reports feeling better and on her evaluation for oxygen needs demonstrated good saturation on room air at rest and on exertion. Cardiovascular: Rubs, no gallops, no murmur, unable to properly assess JVD with body habitus. Respiratory: Improved air movement bilaterally, no crackles, no using accessory muscles.  Positive rhonchi  appreciated. Abdomen: Obese, nontender, nondistended, positive  bowel sounds Extremities: No cyanosis or clubbing.  Discharge Instructions   Discharge Instructions    Discharge instructions   Complete by: As directed    Maintain adequate hydration Take medications as prescribed Follow low calorie diet and pursuit lifestyle changes as discussed to assist you losing weight. Arrange follow-up with PCP in 10 days.     Allergies as of 08/28/2019   No Known Allergies     Medication List    TAKE these medications   acetaminophen 500 MG tablet Commonly known as: TYLENOL Take 1,000 mg by mouth every 6 (six) hours as needed for mild pain.   albuterol 108 (90 Base) MCG/ACT inhaler Commonly known as: VENTOLIN HFA Inhale 2 puffs into the lungs every 6 (six) hours as needed for wheezing or shortness of breath.   ALPRAZolam 0.5 MG tablet Commonly known as: XANAX Take 0.5 mg by mouth at bedtime.   cefdinir 300 MG capsule Commonly known as: OMNICEF Take 1 capsule (300 mg total) by mouth 2 (two) times daily for 5 days.   dextromethorphan-guaiFENesin 30-600 MG 12hr tablet Commonly known as: MUCINEX DM Take 1 tablet by mouth 2 (two) times daily.   escitalopram 10 MG tablet Commonly known as: LEXAPRO Take 10 mg by mouth daily.   pantoprazole 40 MG tablet Commonly known as: PROTONIX Take 40 mg by mouth daily.      No Known Allergies  Follow-up Information    Asencion Noble, MD. Schedule an appointment as soon as possible for a visit in 10 day(s).   Specialty: Internal Medicine Contact information: [address] [place] [address] [phone]               The results of significant diagnostics from this hospitalization (including imaging, microbiology, ancillary and laboratory) are listed below for reference.    Significant Diagnostic Studies: DG Chest Port 1 View  Result Date: 08/26/2019 CLINICAL DATA:  Shortness of breath. EXAM: PORTABLE CHEST 1 VIEW  COMPARISON:  Chest x-ray 02/11/2011. FINDINGS: Mediastinum hilar structures normal. Borderline cardiomegaly and pulmonary venous congestion. Bibasilar pulmonary infiltrates/edema. Small bilateral layering pleural effusions cannot be excluded. Left costophrenic angle incompletely imaged. No pneumothorax. IMPRESSION: Borderline cardiomegaly and pulmonary venous congestion. Bibasilar pulmonary infiltrate/edema. Small bilateral layering pleural effusions cannot be excluded. Electronically Signed   By: Marcello Moores  Register   On: 08/26/2019 09:52   ECHOCARDIOGRAM COMPLETE  Result Date: 08/26/2019    ECHOCARDIOGRAM REPORT   Patient Name:   Nikeria A Aman Date of Exam: 08/26/2019 Medical Rec #:  [id number]        Height:       66.0 in Accession #:    [id number]       Weight:       273.0 lb Date of Birth:  11-May-1960       BSA:          2.283 m Patient Age:    59 years         BP:           119/76 mmHg Patient Gender: F                HR:           66 bpm. Exam Location:  [facility] Procedure: 2D Echo, Cardiac Doppler and Color Doppler Indications:    Acute Respiratory Insufficiency 518.82 / R06.89  History:        Patient has no prior history of Echocardiogram examinations.  GERD, Obesity.  Sonographer:    Alvino Chapel RCS Referring Phys: [place]  1. Left ventricular ejection fraction, by estimation, is 65 to 70%. The left ventricle has normal function. The left ventricle has no regional wall motion abnormalities. Left ventricular diastolic parameters were normal.  2. Right ventricular systolic function is normal. The right ventricular size is normal.  3. The mitral valve is normal in structure. No evidence of mitral valve regurgitation. No evidence of mitral stenosis.  4. The aortic valve has an indeterminant number of cusps. Aortic valve regurgitation is not visualized. No aortic stenosis is present.  5. The inferior vena cava is normal in size with greater than 50% respiratory  variability, suggesting right atrial pressure of 3 mmHg. FINDINGS  Left Ventricle: Left ventricular ejection fraction, by estimation, is 65 to 70%. The left ventricle has normal function. The left ventricle has no regional wall motion abnormalities. The left ventricular internal cavity size was normal in size. There is  no left ventricular hypertrophy. Left ventricular diastolic parameters were normal. Right Ventricle: The right ventricular size is normal. No increase in right ventricular wall thickness. Right ventricular systolic function is normal. Left Atrium: Left atrial size was normal in size. Right Atrium: Right atrial size was normal in size. Pericardium: There is no evidence of pericardial effusion. Mitral Valve: The mitral valve is normal in structure. No evidence of mitral valve regurgitation. No evidence of mitral valve stenosis. Tricuspid Valve: The tricuspid valve is normal in structure. Tricuspid valve regurgitation is not demonstrated. No evidence of tricuspid stenosis. Aortic Valve: The aortic valve has an indeterminant number of cusps. Aortic valve regurgitation is not visualized. No aortic stenosis is present. Aortic valve mean gradient measures 3.2 mmHg. Aortic valve peak gradient measures 4.9 mmHg. Aortic valve area, by VTI measures 2.50 cm. Pulmonic Valve: The pulmonic valve was not well visualized. Pulmonic valve regurgitation is not visualized. No evidence of pulmonic stenosis. Aorta: The aortic root is normal in size and structure. Venous: The inferior vena cava is normal in size with greater than 50% respiratory variability, suggesting right atrial pressure of 3 mmHg. IAS/Shunts: No atrial level shunt detected by color flow Doppler.  LEFT VENTRICLE PLAX 2D LVIDd:         5.12 cm  Diastology LVIDs:         2.79 cm  LV e' lateral:   8.16 cm/s LV PW:         0.97 cm  LV E/e' lateral: 9.7 LV IVS:        0.93 cm  LV e' medial:    8.38 cm/s LVOT diam:     2.00 cm  LV E/e' medial:  9.4 LV SV:          65 LV SV Index:   28 LVOT Area:     3.14 cm  RIGHT VENTRICLE RV S prime:     12.50 cm/s TAPSE (M-mode): 2.3 cm LEFT ATRIUM             Index       RIGHT ATRIUM           Index LA diam:        3.70 cm 1.62 cm/m  RA Area:     17.20 cm LA Vol (A2C):   50.0 ml 21.91 ml/m RA Volume:   46.70 ml  20.46 ml/m LA Vol (A4C):   41.7 ml 18.27 ml/m LA Biplane Vol: 46.6 ml 20.42 ml/m  AORTIC VALVE AV Area (  Vmax):    2.73 cm AV Area (Vmean):   2.50 cm AV Area (VTI):     2.50 cm AV Vmax:           110.33 cm/s AV Vmean:          86.432 cm/s AV VTI:            0.259 m AV Peak Grad:      4.9 mmHg AV Mean Grad:      3.2 mmHg LVOT Vmax:         95.90 cm/s LVOT Vmean:        68.900 cm/s LVOT VTI:          0.206 m LVOT/AV VTI ratio: 0.80  AORTA Ao Root diam: 2.80 cm MITRAL VALVE MV Area (PHT): 3.66 cm    SHUNTS MV Decel Time: 207 msec    Systemic VTI:  0.21 m MV E velocity: 79.00 cm/s  Systemic Diam: 2.00 cm MV A velocity: 79.00 cm/s MV E/A ratio:  1.00 Carlyle Dolly MD Electronically signed by Carlyle Dolly MD Signature Date/Time: 08/26/2019/4:23:57 PM    Final     Microbiology: Recent Results (from the past 240 hour(s))  SARS Coronavirus 2 by RT PCR (hospital order, performed in [facility] hospital lab) Nasopharyngeal Nasopharyngeal Swab     Status: None   Collection Time: 08/26/19  9:20 AM   Specimen: Nasopharyngeal Swab  Result Value Ref Range Status   SARS Coronavirus 2 NEGATIVE NEGATIVE Final    Comment: (NOTE) SARS-CoV-2 target nucleic acids are NOT DETECTED.  The SARS-CoV-2 RNA is generally detectable in upper and lower respiratory specimens during the acute phase of infection. The lowest concentration of SARS-CoV-2 viral copies this assay can detect is 250 copies / mL. A negative result does not preclude SARS-CoV-2 infection and should not be used as the sole basis for treatment or other patient management decisions.  A negative result may occur with improper specimen collection / handling,  submission of specimen other than nasopharyngeal swab, presence of viral mutation(s) within the areas targeted by this assay, and inadequate number of viral copies (<250 copies / mL). A negative result must be combined with clinical observations, patient history, and epidemiological information.  Fact Sheet for Patients:   StrictlyIdeas.no  Fact Sheet for Healthcare Providers: BankingDealers.co.za  This test is not yet approved or  cleared by the [place] FDA and has been authorized for detection and/or diagnosis of SARS-CoV-2 by FDA under an Emergency Use Authorization (EUA).  This EUA will remain in effect (meaning this test can be used) for the duration of the COVID-19 declaration under Section 564(b)(1) of the Act, 21 U.S.C. section 360bbb-3(b)(1), unless the authorization is terminated or revoked sooner.  Performed at [facility], [address], [place], [place] [address]      Labs: Basic Metabolic Panel: Recent Labs  Lab 08/26/19 [phone] 08/27/19 0627 08/28/19 0642  NA 140 141 140  K 4.0 4.2 4.4  CL 104 101 102  CO2 26 28 28   GLUCOSE 117* 112* 110*  BUN 19 20 20   CREATININE 0.74 0.63 0.69  CALCIUM 9.2 9.3 9.2   Liver Function Tests: Recent Labs  Lab 08/26/19 0951  AST 25  ALT 33  ALKPHOS 89  BILITOT 0.5  PROT 7.7  ALBUMIN 4.2   CBC: Recent Labs  Lab 08/26/19 0951 08/27/19 0627  WBC 11.9* 12.0*  NEUTROABS 7.6  --   HGB 15.6* 14.7  HCT 48.5* 46.4*  MCV 101.3* 102.2*  PLT 419* 414*   BNP (last 3 results) Recent Labs    08/26/19 0951  BNP 48.0    Signed:  Barton Dubois MD.  [facility] 08/28/2019, 12:44 PM

## 2019-08-28 NOTE — Progress Notes (Signed)
Pt lying in bed, O2 sat 83% on Room Air. Pt placed on 1L [place], O2 Sat up to 90%.

## 2019-08-28 NOTE — Progress Notes (Signed)
Nsg Discharge Note  Admit Date:  08/26/2019 Discharge date: 08/28/2019   Phylis Bougie to be D/C'd Home per MD order.  AVS completed.  Copy for chart, and copy for patient signed, and dated. Patient/caregiver able to verbalize understanding.  Discharge Medication: Allergies as of 08/28/2019   No Known Allergies     Medication List    TAKE these medications   acetaminophen 500 MG tablet Commonly known as: TYLENOL Take 1,000 mg by mouth every 6 (six) hours as needed for mild pain.   albuterol 108 (90 Base) MCG/ACT inhaler Commonly known as: VENTOLIN HFA Inhale 2 puffs into the lungs every 6 (six) hours as needed for wheezing or shortness of breath.   ALPRAZolam 0.5 MG tablet Commonly known as: XANAX Take 0.5 mg by mouth at bedtime.   cefdinir 300 MG capsule Commonly known as: OMNICEF Take 1 capsule (300 mg total) by mouth 2 (two) times daily for 5 days.   dextromethorphan-guaiFENesin 30-600 MG 12hr tablet Commonly known as: MUCINEX DM Take 1 tablet by mouth 2 (two) times daily.   escitalopram 10 MG tablet Commonly known as: LEXAPRO Take 10 mg by mouth daily.   pantoprazole 40 MG tablet Commonly known as: PROTONIX Take 40 mg by mouth daily.       Discharge Assessment: Vitals:   08/28/19 0731 08/28/19 0736  BP:    Pulse:    Resp:    Temp:    SpO2: 93% 93%   Skin clean, dry and intact without evidence of skin break down, no evidence of skin tears noted. IV catheter discontinued intact. Site without signs and symptoms of complications - no redness or edema noted at insertion site, patient denies c/o pain - only slight tenderness at site.  Dressing with slight pressure applied.  D/c Instructions-Education: Discharge instructions given to patient/family with verbalized understanding. D/c education completed with patient/family including follow up instructions, medication list, d/c activities limitations if indicated, with other d/c instructions as indicated by MD -  patient able to verbalize understanding, all questions fully answered. Patient instructed to return to ED, call 911, or call MD for any changes in condition.  Patient escorted via [place], and D/C home via private auto.  Zenaida Deed, RN 08/28/2019 1:21 PM

## 2019-08-28 NOTE — Progress Notes (Signed)
SATURATION QUALIFICATIONS: (This note is used to comply with regulatory documentation for home oxygen)  Patient Saturations on Room Air at Rest 94 %  Patient Saturations on Room Air while Ambulating 96%  Patient Saturations on Liters of oxygen while Ambulating = 0%  Please briefly explain why patient needs home oxygen:  Oxygen saturation was 92 when returned to bed from ambulating in the hallway

## 2019-08-29 LAB — LEGIONELLA PNEUMOPHILA SEROGP 1 UR AG: L. pneumophila Serogp 1 Ur Ag: NEGATIVE

## 2019-09-12 ENCOUNTER — Other Ambulatory Visit (HOSPITAL_COMMUNITY): Payer: Self-pay | Admitting: Internal Medicine

## 2019-09-12 DIAGNOSIS — J189 Pneumonia, unspecified organism: Secondary | ICD-10-CM

## 2019-09-12 DIAGNOSIS — Z1231 Encounter for screening mammogram for malignant neoplasm of breast: Secondary | ICD-10-CM

## 2019-10-13 ENCOUNTER — Other Ambulatory Visit: Payer: Self-pay

## 2019-10-13 ENCOUNTER — Ambulatory Visit (HOSPITAL_COMMUNITY)
Admission: RE | Admit: 2019-10-13 | Discharge: 2019-10-13 | Disposition: A | Discharge status: Home / Self Care | Payer: 59 | Source: Ambulatory Visit | Attending: Internal Medicine | Admitting: Internal Medicine

## 2019-10-13 DIAGNOSIS — Z1231 Encounter for screening mammogram for malignant neoplasm of breast: Secondary | ICD-10-CM | POA: Insufficient documentation

## 2019-10-13 DIAGNOSIS — J189 Pneumonia, unspecified organism: Secondary | ICD-10-CM | POA: Diagnosis not present

## 2019-10-17 ENCOUNTER — Other Ambulatory Visit (HOSPITAL_COMMUNITY): Payer: Self-pay | Admitting: Internal Medicine

## 2019-10-17 DIAGNOSIS — R928 Other abnormal and inconclusive findings on diagnostic imaging of breast: Secondary | ICD-10-CM

## 2019-10-18 ENCOUNTER — Ambulatory Visit (HOSPITAL_COMMUNITY)
Admission: RE | Admit: 2019-10-18 | Discharge: 2019-10-18 | Disposition: A | Discharge status: Home / Self Care | Payer: 59 | Source: Ambulatory Visit | Attending: Internal Medicine | Admitting: Internal Medicine

## 2019-10-18 ENCOUNTER — Other Ambulatory Visit: Payer: Self-pay

## 2019-10-18 DIAGNOSIS — R928 Other abnormal and inconclusive findings on diagnostic imaging of breast: Secondary | ICD-10-CM | POA: Insufficient documentation

## 2019-12-13 ENCOUNTER — Other Ambulatory Visit (HOSPITAL_COMMUNITY): Payer: Self-pay | Admitting: Internal Medicine

## 2019-12-13 DIAGNOSIS — Z8701 Personal history of pneumonia (recurrent): Secondary | ICD-10-CM

## 2019-12-15 ENCOUNTER — Other Ambulatory Visit: Payer: Self-pay

## 2019-12-15 ENCOUNTER — Ambulatory Visit (HOSPITAL_COMMUNITY)
Admission: RE | Admit: 2019-12-15 | Discharge: 2019-12-15 | Disposition: A | Discharge status: Home / Self Care | Payer: 59 | Source: Ambulatory Visit | Attending: Internal Medicine | Admitting: Internal Medicine

## 2019-12-15 DIAGNOSIS — Z8701 Personal history of pneumonia (recurrent): Secondary | ICD-10-CM

## 2020-02-14 ENCOUNTER — Other Ambulatory Visit (HOSPITAL_COMMUNITY): Payer: Self-pay | Admitting: Internal Medicine

## 2020-02-14 DIAGNOSIS — R928 Other abnormal and inconclusive findings on diagnostic imaging of breast: Secondary | ICD-10-CM

## 2020-02-28 ENCOUNTER — Ambulatory Visit (HOSPITAL_COMMUNITY)
Admission: RE | Admit: 2020-02-28 | Discharge: 2020-02-28 | Disposition: A | Discharge status: Home / Self Care | Payer: 59 | Source: Ambulatory Visit | Attending: Internal Medicine | Admitting: Internal Medicine

## 2020-02-28 ENCOUNTER — Other Ambulatory Visit: Payer: Self-pay

## 2020-02-28 DIAGNOSIS — R928 Other abnormal and inconclusive findings on diagnostic imaging of breast: Secondary | ICD-10-CM

## 2020-06-10 ENCOUNTER — Ambulatory Visit
Admission: EM | Admit: 2020-06-10 | Discharge: 2020-06-10 | Disposition: A | Discharge status: Home / Self Care | Payer: 59

## 2020-06-10 ENCOUNTER — Other Ambulatory Visit: Payer: Self-pay

## 2020-06-10 ENCOUNTER — Encounter: Payer: Self-pay | Admitting: Emergency Medicine

## 2020-06-10 ENCOUNTER — Encounter (HOSPITAL_COMMUNITY): Payer: Self-pay | Admitting: *Deleted

## 2020-06-10 ENCOUNTER — Emergency Department (HOSPITAL_COMMUNITY): Payer: 59

## 2020-06-10 ENCOUNTER — Emergency Department (HOSPITAL_COMMUNITY)
Admission: EM | Admit: 2020-06-10 | Discharge: 2020-06-10 | Disposition: A | Discharge status: Home / Self Care | Payer: 59 | Attending: Emergency Medicine | Admitting: Emergency Medicine

## 2020-06-10 DIAGNOSIS — U071 COVID-19: Secondary | ICD-10-CM | POA: Diagnosis not present

## 2020-06-10 DIAGNOSIS — J029 Acute pharyngitis, unspecified: Secondary | ICD-10-CM | POA: Diagnosis present

## 2020-06-10 MED ORDER — DEXAMETHASONE SODIUM PHOSPHATE 10 MG/ML IJ SOLN
10.0000 mg | Freq: Once | INTRAMUSCULAR | Status: AC
  Administered 2020-06-10: 10 mg via INTRAVENOUS
  Filled 2020-06-10: qty 1

## 2020-06-10 MED ORDER — NIRMATRELVIR/RITONAVIR (PAXLOVID)TABLET: 3.0000 | ORAL_TABLET | Freq: Two times a day (BID) | ORAL | 0 refills | Status: AC

## 2020-06-10 NOTE — ED Provider Notes (Signed)
Patient with abnormal <90 SATs COVID -19 positive in triage. Sent to ER for evaluation.   Scot Jun, FNP 06/10/20 [phone]

## 2020-06-10 NOTE — ED Notes (Signed)
Patient is being discharged from the Urgent Care and sent to the Emergency Department via pov. Per Molli Barrows, patient is in need of higher level of care due to covid + low o2 sat. Patient is aware and verbalizes understanding of plan of care.  Vitals:   06/10/20 1337  BP: (!) 170/86  Pulse: 94  Resp: (!) 22  Temp: 99.2 F (37.3 C)  SpO2: (!) 88%

## 2020-06-10 NOTE — Discharge Instructions (Signed)
Quarantine for a full 5 days from first day of sx - only out of quarantine if you are ferver free and doing better  ER for oxygen consistently lower than 90 or for worsening symptoms including shortness of breath.  Paxlovid for 5 days Albuterol inhaler 2 puffs every 4 hours as needed Tylenol / motrin for aches / fevers.

## 2020-06-10 NOTE — ED Triage Notes (Signed)
Referred from urgent care for evaluation due to low oxygen sat

## 2020-06-10 NOTE — ED Triage Notes (Signed)
Body aches, cough, low grade fever, headache and loss of taste that started yesterday.  Had positive home covid test today.

## 2020-06-10 NOTE — ED Provider Notes (Signed)
Emergency Medicine Provider Triage Evaluation Note  Allison Watts , a 60 y.o. female  was evaluated in triage.  Pt complains of shortness of breath low oxygen sats positive COVID.  Sent in from urgent care for oxygen sats below 90.  But upon arrival here her oxygen sats are 93%..  Review of Systems  Positive: Shortness of breath Negative  Physical Exam  BP (!) 161/94   Pulse 91   Temp 98.9 F (37.2 C)   Resp 20   SpO2 93%  Gen:   Awake, no distress  Resp:  Mild tachypnea MSK:   Moves extremities without difficulty  Other:  Oxygen sats here are 93%.  Medical Decision Making  Medically screening exam initiated at 2:40 PM.  Appropriate orders placed.  Allison Watts was informed that the remainder of the evaluation will be completed by another provider, this initial triage assessment does not replace that evaluation, and the importance of remaining in the ED until their evaluation is complete.  Patient is oxygen sats appear better here.  Will order portable chest x-ray.  Patient with positive COVID test   Fredia Sorrow, MD 06/10/20 1441

## 2020-06-10 NOTE — ED Provider Notes (Signed)
[facility] EMERGENCY DEPARTMENT Provider Note   CSN: [id number] Arrival date & time: 06/10/20  1349     History Chief Complaint  Patient presents with  . Shortness of Breath    Allison Watts is a 60 y.o. female.  HPI   Pt is a 60 y/o female - has hx of depression and takes PPI / Lexapro - no other meds - no hx of chronic lung disease - started with myalgias on Thursday, sore throat on Friday - worse yesterday - today took home Covid test and came back positive - husband has multiple coworkers who have had covid recently - she went to [facility] - had Low O2 and told to come here.  She has no cough / SOB or CP - she has no n/v/d - she is fully vaccinated and boosted to covid - sx are persistent, nothing makes better- has albutetrol inhaler at home - has never used it - it is full.  Past Medical History:  Diagnosis Date  . Anxiety   . Arthritis    in knees  . Depression   . GERD (gastroesophageal reflux disease)   . History of migraine     Patient Active Problem List   Diagnosis Date Noted  . Community acquired pneumonia   . Acute respiratory failure with hypoxia ([place]) 08/26/2019  . GERD (gastroesophageal reflux disease) 08/26/2019  . Depression 08/26/2019  . Anxiety 08/26/2019  . Hx of migraines 08/26/2019  . Ventral incisional hernia 10/23/2015    Past Surgical History:  Procedure Laterality Date  . ABDOMINAL EXPLORATION SURGERY     removed one ovary  . ABDOMINAL HYSTERECTOMY    . CHOLECYSTECTOMY    . COLONOSCOPY  09/27/2010   Procedure: COLONOSCOPY;  Surgeon: Rogene Houston, MD;  [facility];  Service: Endoscopy;  Laterality: N/A;  8:30  . ESOPHAGOGASTRODUODENOSCOPY  09/27/2010   Procedure: ESOPHAGOGASTRODUODENOSCOPY (EGD);  Surgeon: Rogene Houston, MD;  [facility];  Service: Endoscopy;  Laterality: N/A;  . GASTRIC BYPASS  2002  . INSERTION OF MESH N/A 10/23/2015   Procedure: INSERTION OF MESH;  Surgeon: Donnie Mesa, MD;  Location: [place];   Service: General;  Laterality: N/A;  . VENTRAL HERNIA REPAIR N/A 10/23/2015   Procedure: LAPAROSCOPIC VENTRAL HERNIA REPAIR WITH MESH;  Surgeon: Donnie Mesa, MD;  Location: [place];  Service: General;  Laterality: N/A;     OB History   No obstetric history on file.     Family History  Problem Relation Age of Onset  . Breast cancer Mother   . Hypertension Mother     Social History   Tobacco Use  . Smoking status: Never Smoker  . Smokeless tobacco: Never Used  Substance Use Topics  . Alcohol use: Yes    Alcohol/week: 7.0 standard drinks    Types: 7 Glasses of wine per week  . Drug use: No    Home Medications Prior to Admission medications   Medication Sig Start Date End Date Taking? Authorizing Provider  nirmatrelvir/ritonavir EUA (PAXLOVID) TABS Take 3 tablets by mouth 2 (two) times daily for 5 days. Take nirmatrelvir (150 mg) 2 tablet(s) twice daily for 5 days and ritonavir (100 mg) one tablet twice daily for 5 days. 06/10/20 06/15/20 Yes Noemi Chapel, MD  acetaminophen (TYLENOL) 500 MG tablet Take 1,000 mg by mouth every 6 (six) hours as needed for mild pain.    [provider]  albuterol (VENTOLIN HFA) 108 (90 Base) MCG/ACT inhaler Inhale 2  puffs into the lungs every 6 (six) hours as needed for wheezing or shortness of breath. 08/28/19   Barton Dubois, MD  ALPRAZolam Duanne Moron) 0.5 MG tablet Take 0.5 mg by mouth at bedtime.     [provider]  dextromethorphan-guaiFENesin (MUCINEX DM) 30-600 MG 12hr tablet Take 1 tablet by mouth 2 (two) times daily. 08/28/19   Barton Dubois, MD  escitalopram (LEXAPRO) 10 MG tablet Take 10 mg by mouth daily.    [provider]  pantoprazole (PROTONIX) 40 MG tablet Take 40 mg by mouth daily.    [provider]    Allergies    Patient has no known allergies.  Review of Systems   Review of Systems  All other systems reviewed and are negative.   Physical Exam Updated Vital Signs BP (!) 161/94   Pulse 91    Temp 98.9 F (37.2 C)   Resp 20   SpO2 93%   Physical Exam Vitals and nursing note reviewed.  Constitutional:      General: She is not in acute distress.    Appearance: She is well-developed.  HENT:     Head: Normocephalic and atraumatic.     Mouth/Throat:     Mouth: Mucous membranes are moist.     Pharynx: Posterior oropharyngeal erythema present. No pharyngeal swelling or oropharyngeal exudate.  Eyes:     General: No scleral icterus.       Right eye: No discharge.        Left eye: No discharge.     Conjunctiva/sclera: Conjunctivae normal.     Pupils: Pupils are equal, round, and reactive to light.  Neck:     Thyroid: No thyromegaly.     Vascular: No JVD.  Cardiovascular:     Rate and Rhythm: Normal rate and regular rhythm.     Heart sounds: Normal heart sounds. No murmur heard. No friction rub. No gallop.   Pulmonary:     Effort: Pulmonary effort is normal. No respiratory distress.     Breath sounds: Normal breath sounds. No wheezing or rales.     Comments: No rales, full sentences, clear sounds Abdominal:     General: Bowel sounds are normal. There is no distension.     Palpations: Abdomen is soft. There is no mass.     Tenderness: There is no abdominal tenderness.  Musculoskeletal:        General: No tenderness. Normal range of motion.     Cervical back: Normal range of motion and neck supple.  Lymphadenopathy:     Cervical: No cervical adenopathy.  Skin:    General: Skin is warm and dry.     Findings: No erythema or rash.  Neurological:     Mental Status: She is alert.     Coordination: Coordination normal.  Psychiatric:        Behavior: Behavior normal.     ED Results / Procedures / Treatments   Labs (all labs ordered are listed, but only abnormal results are displayed) Labs Reviewed  SARS CORONAVIRUS 2 (TAT 6-24 HRS)    EKG None  Radiology DG Chest Port 1 View  Result Date: 06/10/2020 CLINICAL DATA:  Cough and low-grade fever. EXAM: PORTABLE  CHEST 1 VIEW COMPARISON:  December 15, 2019 FINDINGS: There is no evidence of acute infiltrate, pleural effusion or pneumothorax. The heart size and mediastinal contours are within normal limits. The visualized skeletal structures are unremarkable. IMPRESSION: No active cardiopulmonary disease. Electronically Signed   By: Joyce Gross.D.  On: 06/10/2020 15:06    Procedures Procedures   Medications Ordered in ED Medications  dexamethasone (DECADRON) injection 10 mg (has no administration in time range)    ED Course  I have reviewed the triage vital signs and the nursing notes.  Pertinent labs & imaging results that were available during my care of the patient were reviewed by me and considered in my medical decision making (see chart for details).    MDM Rules/Calculators/A&P                          Well appearing sat's of 92-94 % with  Normal lung sounds and clear CXR - pt is stable for d/c - doesn't need O2 but is borderline - Paxlovid and Decadron - has albuterol - pt agreeable - no distress, stable for d/c - pt agreeable to return for worsening symptoms.  Allison Watts was evaluated in Emergency Department on 06/10/2020 for the symptoms described in the history of present illness. She was evaluated in the context of the global COVID-19 pandemic, which necessitated consideration that the patient might be at risk for infection with the SARS-CoV-2 virus that causes COVID-19. Institutional protocols and algorithms that pertain to the evaluation of patients at risk for COVID-19 are in a state of rapid change based on information released by regulatory bodies including the CDC and federal and state organizations. These policies and algorithms were followed during the patient's care in the ED.   Final Clinical Impression(s) / ED Diagnoses Final diagnoses:  [id number]    Rx / DC Orders ED Discharge Orders         Ordered    nirmatrelvir/ritonavir EUA (PAXLOVID) TABS  2 times daily         06/10/20 1518           Noemi Chapel, MD 06/10/20 [phone]

## 2020-06-10 NOTE — ED Notes (Signed)
Pt verbalized she took a home test and she is positive for covid.

## 2020-06-11 LAB — SARS CORONAVIRUS 2 (TAT 6-24 HRS): SARS Coronavirus 2: POSITIVE — AB

## 2020-08-29 ENCOUNTER — Encounter: Payer: Self-pay | Admitting: Physician Assistant

## 2020-08-30 ENCOUNTER — Encounter (INDEPENDENT_AMBULATORY_CARE_PROVIDER_SITE_OTHER): Payer: Self-pay | Admitting: Gastroenterology

## 2020-08-30 ENCOUNTER — Other Ambulatory Visit: Payer: Self-pay

## 2020-08-30 ENCOUNTER — Ambulatory Visit (INDEPENDENT_AMBULATORY_CARE_PROVIDER_SITE_OTHER): Payer: 59 | Admitting: Gastroenterology

## 2020-08-30 DIAGNOSIS — K602 Anal fissure, unspecified: Secondary | ICD-10-CM

## 2020-08-30 DIAGNOSIS — K640 First degree hemorrhoids: Secondary | ICD-10-CM

## 2020-08-30 DIAGNOSIS — K649 Unspecified hemorrhoids: Secondary | ICD-10-CM | POA: Insufficient documentation

## 2020-08-30 NOTE — Patient Instructions (Signed)
Start diltiazem and lidocaine twice a day for 3 months  Start taking Miralax 2 capful every day. If after two weeks there is no improvement, increase to 1 capful every 8 hours. Schedule colonoscopy

## 2020-08-30 NOTE — Progress Notes (Signed)
Maylon Peppers, M.D. Gastroenterology & Hepatology [facility] [address] [place], [place] [address]  Primary Care Physician: Asencion Noble, MD [address] [place] [address]  I will communicate my assessment and recommendations to the referring MD via EMR.  Problems: Rectal pain History of anal fissure  History of Present Illness: Allison Watts is a 60 y.o. female with past medical history of GERD, history of anal fissure, who presents for evaluation of rectal pain.  The patient was last seen on 07/27/2018. At that time, the patient was ordered Cardizem 2%.  She was also referred to be evaluated by Dr. Constance Haw for possible surgical treatment of her fissure.  Unfortunately, the patient reports that she did not have an adequate interaction with the previous provider our clinic and decided to not follow-up.  Patient reports that 2 years she had a shoulder surgery. She only took opiates for 2 days but states that she was having constipation prior to taking this medication. She reports that she was having pain in the rectal area with defecation and she was having fresh bloody bowel movement multiple times. She also had significant constipation at that time, she took Miralax and Dulcolax (up to 5-6 per day) but eventually was told to only take Miralax.  Was seen by Dr. Constance Haw in 2020  for management of fissure with diltiazem and lidocaine. She believes the fissure got better after using the ointment for close to 3 months.  She currently reports that she did well after her fissure got treatment.  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 She states that 75% of the times she sees fresh blood in her stool.  Currently she takes Miralax once a day and docusate at night. With this,she has 3-4 bowel movements per day, but states the first bowel movement is usually hard.   The patient denies  having any nausea, vomiting, fever, chills, hematochezia, melena, hematemesis, abdominal distention, abdominal pain, diarrhea, jaundice, pruritus or weight loss.  Last EGD/Colonoscopy: 2012 Small sliding-type hernia without erosive esophagitis. Focal gastritis with a scar proximal to gastrojejunostomy. Moderate size gastric remnant. Normal jejunal mucosa for 20 cm. small polyp ablated via cold biopsy from transverse colon. External hemorrhoids  Past Medical History: Past Medical History:  Diagnosis Date   Anxiety    Arthritis    in knees   Depression    GERD (gastroesophageal reflux disease)    History of migraine     Past Surgical History: Past Surgical History:  Procedure Laterality Date   ABDOMINAL EXPLORATION SURGERY     removed one ovary   ABDOMINAL HYSTERECTOMY     CHOLECYSTECTOMY     COLONOSCOPY  09/27/2010   Procedure: COLONOSCOPY;  Surgeon: Rogene Houston, MD;  [facility];  Service: Endoscopy;  Laterality: N/A;  8:30   ESOPHAGOGASTRODUODENOSCOPY  09/27/2010   Procedure: ESOPHAGOGASTRODUODENOSCOPY (EGD);  Surgeon: Rogene Houston, MD;  [facility];  Service: Endoscopy;  Laterality: N/A;   GASTRIC BYPASS  2002   INSERTION OF MESH N/A 10/23/2015   Procedure: INSERTION OF MESH;  Surgeon: Donnie Mesa, MD;  Location: [place];  Service: General;  Laterality: N/A;   VENTRAL HERNIA REPAIR N/A 10/23/2015   Procedure: LAPAROSCOPIC VENTRAL HERNIA REPAIR WITH MESH;  Surgeon: Donnie Mesa, MD;  [facility];  Service: General;  Laterality: N/A;    Family History: Family History  Problem Relation Age of Onset  Breast cancer Mother    Hypertension Mother     Social History: Social History   Tobacco Use  Smoking Status Never  Smokeless Tobacco Never   Social History   Substance and Sexual Activity  Alcohol Use Yes   Alcohol/week: 7.0 standard drinks   Types: 7 Glasses of wine per week   Social History   Substance and Sexual Activity   Drug Use No    Allergies: No Known Allergies  Medications: Current Outpatient Medications  Medication Sig Dispense Refill   acetaminophen (TYLENOL) 500 MG tablet Take 1,000 mg by mouth every 6 (six) hours as needed for mild pain.     albuterol (VENTOLIN HFA) 108 (90 Base) MCG/ACT inhaler Inhale 2 puffs into the lungs every 6 (six) hours as needed for wheezing or shortness of breath. 18 g 0   ALPRAZolam (XANAX) 0.5 MG tablet Take 0.5 mg by mouth at bedtime.     docusate sodium (COLACE) 250 MG capsule Take 250 mg by mouth daily.     ergocalciferol (VITAMIN D2) 1.25 MG (50000 UT) capsule Take 50,000 Units by mouth once a week.     escitalopram (LEXAPRO) 10 MG tablet Take 10 mg by mouth daily.     pantoprazole (PROTONIX) 40 MG tablet Take 40 mg by mouth daily.     polyethylene glycol (MIRALAX / GLYCOLAX) 17 g packet Take 17 g by mouth daily.     dextromethorphan-guaiFENesin (MUCINEX DM) 30-600 MG 12hr tablet Take 1 tablet by mouth 2 (two) times daily. (Patient not taking: Reported on 08/30/2020) 20 tablet 0   No current facility-administered medications for this visit.    Review of Systems: GENERAL: negative for malaise, night sweats HEENT: No changes in hearing or vision, no nose bleeds or other nasal problems. NECK: Negative for lumps, goiter, pain and significant neck swelling RESPIRATORY: Negative for cough, wheezing CARDIOVASCULAR: Negative for chest pain, leg swelling, palpitations, orthopnea GI: SEE HPI MUSCULOSKELETAL: Negative for joint pain or swelling, back pain, and muscle pain. SKIN: Negative for lesions, rash PSYCH: Negative for sleep disturbance, mood disorder and recent psychosocial stressors. HEMATOLOGY Negative for prolonged bleeding, bruising easily, and swollen nodes. ENDOCRINE: Negative for cold or heat intolerance, polyuria, polydipsia and goiter. NEURO: negative for tremor, gait imbalance, syncope and seizures. The remainder of the review of systems is  noncontributory.   Physical Exam: BP 134/87 (BP Location: Left Arm, Patient Position: Sitting, Cuff Size: Large)   Pulse 86   Temp 98.3 F (36.8 C) (Oral)   Ht '5\' 7"'$  (1.702 m)   Wt 281 lb (127.5 kg)   BMI 44.01 kg/m  GENERAL: The patient is AO x3, in no acute distress. Obese. HEENT: Head is normocephalic and atraumatic. EOMI are intact. Mouth is well hydrated and without lesions. NECK: Supple. No masses LUNGS: Clear to auscultation. No presence of rhonchi/wheezing/rales. Adequate chest expansion HEART: RRR, normal s1 and s2. ABDOMEN: Soft, nontender, no guarding, no peritoneal signs, and nondistended. BS +. No masses. RECTAL EXAM:presence of a small anal fissure at 6 position, also presence of a grade I external hemorrhoid at 5 position which is tender, normal anal tone EXTREMITIES: Without any cyanosis, clubbing, rash, lesions or edema. NEUROLOGIC: AOx3, no focal motor deficit. SKIN: no jaundice, no rashes  Imaging/Labs: as above  I personally reviewed and interpreted the available labs, imaging and endoscopic files.  Impression and Plan: YVONNIA VALIANT is a 60 y.o. female with past medical history of GERD, history of anal fissure, who presents for evaluation  of rectal pain.  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  This is likely related to presence of hard stools and the need to strain.  I explained to the patient that she should increase the amount of MiraLAX she is taking every day and she can stop the intake of docusate to ensure that her stool gets looser.  She would benefit from restarting her diltiazem in combination with lidocaine for 3 months.  The patient understood and agreed.  Also, she is due for colorectal cancer screening as her last colonoscopy was 10 years ago, will schedule a colonoscopy.  - Start diltiazem and lidocaine twice a day for 3 months  - Start taking Miralax 2 capful every day. If after  two weeks there is no improvement, increase to 1 capful every 8 hours. - Stop docusate - Schedule colonoscopy  All questions were answered.      Harvel Quale, MD Gastroenterology and Hepatology [facility]

## 2020-08-30 NOTE — H&P (View-Only) (Signed)
Maylon Peppers, M.D. Gastroenterology & Hepatology [facility] [address] [place], [place] [address]  Primary Care Physician: Asencion Noble, MD [address] [place] [address]  I will communicate my assessment and recommendations to the referring MD via EMR.  Problems: Rectal pain History of anal fissure  History of Present Illness: Allison Watts is a 60 y.o. female with past medical history of GERD, history of anal fissure, who presents for evaluation of rectal pain.  The patient was last seen on 07/27/2018. At that time, the patient was ordered Cardizem 2%.  She was also referred to be evaluated by Dr. Constance Haw for possible surgical treatment of her fissure.  Unfortunately, the patient reports that she did not have an adequate interaction with the previous provider our clinic and decided to not follow-up.  Patient reports that 2 years she had a shoulder surgery. She only took opiates for 2 days but states that she was having constipation prior to taking this medication. She reports that she was having pain in the rectal area with defecation and she was having fresh bloody bowel movement multiple times. She also had significant constipation at that time, she took Miralax and Dulcolax (up to 5-6 per day) but eventually was told to only take Miralax.  Was seen by Dr. Constance Haw in 2020  for management of fissure with diltiazem and lidocaine. She believes the fissure got better after using the ointment for close to 3 months.  She currently reports that she did well after her fissure got treatment.  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 She states that 75% of the times she sees fresh blood in her stool.  Currently she takes Miralax once a day and docusate at night. With this,she has 3-4 bowel movements per day, but states the first bowel movement is usually hard.   The patient denies  having any nausea, vomiting, fever, chills, hematochezia, melena, hematemesis, abdominal distention, abdominal pain, diarrhea, jaundice, pruritus or weight loss.  Last EGD/Colonoscopy: 2012 Small sliding-type hernia without erosive esophagitis. Focal gastritis with a scar proximal to gastrojejunostomy. Moderate size gastric remnant. Normal jejunal mucosa for 20 cm. small polyp ablated via cold biopsy from transverse colon. External hemorrhoids  Past Medical History: Past Medical History:  Diagnosis Date   Anxiety    Arthritis    in knees   Depression    GERD (gastroesophageal reflux disease)    History of migraine     Past Surgical History: Past Surgical History:  Procedure Laterality Date   ABDOMINAL EXPLORATION SURGERY     removed one ovary   ABDOMINAL HYSTERECTOMY     CHOLECYSTECTOMY     COLONOSCOPY  09/27/2010   Procedure: COLONOSCOPY;  Surgeon: Rogene Houston, MD;  [facility];  Service: Endoscopy;  Laterality: N/A;  8:30   ESOPHAGOGASTRODUODENOSCOPY  09/27/2010   Procedure: ESOPHAGOGASTRODUODENOSCOPY (EGD);  Surgeon: Rogene Houston, MD;  [facility];  Service: Endoscopy;  Laterality: N/A;   GASTRIC BYPASS  2002   INSERTION OF MESH N/A 10/23/2015   Procedure: INSERTION OF MESH;  Surgeon: Donnie Mesa, MD;  Location: [place];  Service: General;  Laterality: N/A;   VENTRAL HERNIA REPAIR N/A 10/23/2015   Procedure: LAPAROSCOPIC VENTRAL HERNIA REPAIR WITH MESH;  Surgeon: Donnie Mesa, MD;  [facility];  Service: General;  Laterality: N/A;    Family History: Family History  Problem Relation Age of Onset  Breast cancer Mother    Hypertension Mother     Social History: Social History   Tobacco Use  Smoking Status Never  Smokeless Tobacco Never   Social History   Substance and Sexual Activity  Alcohol Use Yes   Alcohol/week: 7.0 standard drinks   Types: 7 Glasses of wine per week   Social History   Substance and Sexual Activity   Drug Use No    Allergies: No Known Allergies  Medications: Current Outpatient Medications  Medication Sig Dispense Refill   acetaminophen (TYLENOL) 500 MG tablet Take 1,000 mg by mouth every 6 (six) hours as needed for mild pain.     albuterol (VENTOLIN HFA) 108 (90 Base) MCG/ACT inhaler Inhale 2 puffs into the lungs every 6 (six) hours as needed for wheezing or shortness of breath. 18 g 0   ALPRAZolam (XANAX) 0.5 MG tablet Take 0.5 mg by mouth at bedtime.     docusate sodium (COLACE) 250 MG capsule Take 250 mg by mouth daily.     ergocalciferol (VITAMIN D2) 1.25 MG (50000 UT) capsule Take 50,000 Units by mouth once a week.     escitalopram (LEXAPRO) 10 MG tablet Take 10 mg by mouth daily.     pantoprazole (PROTONIX) 40 MG tablet Take 40 mg by mouth daily.     polyethylene glycol (MIRALAX / GLYCOLAX) 17 g packet Take 17 g by mouth daily.     dextromethorphan-guaiFENesin (MUCINEX DM) 30-600 MG 12hr tablet Take 1 tablet by mouth 2 (two) times daily. (Patient not taking: Reported on 08/30/2020) 20 tablet 0   No current facility-administered medications for this visit.    Review of Systems: GENERAL: negative for malaise, night sweats HEENT: No changes in hearing or vision, no nose bleeds or other nasal problems. NECK: Negative for lumps, goiter, pain and significant neck swelling RESPIRATORY: Negative for cough, wheezing CARDIOVASCULAR: Negative for chest pain, leg swelling, palpitations, orthopnea GI: SEE HPI MUSCULOSKELETAL: Negative for joint pain or swelling, back pain, and muscle pain. SKIN: Negative for lesions, rash PSYCH: Negative for sleep disturbance, mood disorder and recent psychosocial stressors. HEMATOLOGY Negative for prolonged bleeding, bruising easily, and swollen nodes. ENDOCRINE: Negative for cold or heat intolerance, polyuria, polydipsia and goiter. NEURO: negative for tremor, gait imbalance, syncope and seizures. The remainder of the review of systems is  noncontributory.   Physical Exam: BP 134/87 (BP Location: Left Arm, Patient Position: Sitting, Cuff Size: Large)   Pulse 86   Temp 98.3 F (36.8 C) (Oral)   Ht '5\' 7"'$  (1.702 m)   Wt 281 lb (127.5 kg)   BMI 44.01 kg/m  GENERAL: The patient is AO x3, in no acute distress. Obese. HEENT: Head is normocephalic and atraumatic. EOMI are intact. Mouth is well hydrated and without lesions. NECK: Supple. No masses LUNGS: Clear to auscultation. No presence of rhonchi/wheezing/rales. Adequate chest expansion HEART: RRR, normal s1 and s2. ABDOMEN: Soft, nontender, no guarding, no peritoneal signs, and nondistended. BS +. No masses. RECTAL EXAM:presence of a small anal fissure at 6 position, also presence of a grade I external hemorrhoid at 5 position which is tender, normal anal tone EXTREMITIES: Without any cyanosis, clubbing, rash, lesions or edema. NEUROLOGIC: AOx3, no focal motor deficit. SKIN: no jaundice, no rashes  Imaging/Labs: as above  I personally reviewed and interpreted the available labs, imaging and endoscopic files.  Impression and Plan: Allison Watts is a 60 y.o. female with past medical history of GERD, history of anal fissure, who presents for evaluation  of rectal pain.  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  This is likely related to presence of hard stools and the need to strain.  I explained to the patient that she should increase the amount of MiraLAX she is taking every day and she can stop the intake of docusate to ensure that her stool gets looser.  She would benefit from restarting her diltiazem in combination with lidocaine for 3 months.  The patient understood and agreed.  Also, she is due for colorectal cancer screening as her last colonoscopy was 10 years ago, will schedule a colonoscopy.  - Start diltiazem and lidocaine twice a day for 3 months  - Start taking Miralax 2 capful every day. If after  two weeks there is no improvement, increase to 1 capful every 8 hours. - Stop docusate - Schedule colonoscopy  All questions were answered.      Harvel Quale, MD Gastroenterology and Hepatology [facility]

## 2020-09-12 ENCOUNTER — Telehealth (INDEPENDENT_AMBULATORY_CARE_PROVIDER_SITE_OTHER): Payer: Self-pay | Admitting: *Deleted

## 2020-09-12 NOTE — Telephone Encounter (Signed)
Patient called to schedule TCS - please call @ [phone]

## 2020-09-19 ENCOUNTER — Telehealth (INDEPENDENT_AMBULATORY_CARE_PROVIDER_SITE_OTHER): Payer: Self-pay

## 2020-09-19 ENCOUNTER — Encounter (INDEPENDENT_AMBULATORY_CARE_PROVIDER_SITE_OTHER): Payer: Self-pay

## 2020-09-19 ENCOUNTER — Other Ambulatory Visit (INDEPENDENT_AMBULATORY_CARE_PROVIDER_SITE_OTHER): Payer: Self-pay

## 2020-09-19 MED ORDER — CLENPIQ 10-3.5-12 MG-GM -GM/160ML PO SOLN: 1.0000 | Freq: Once | ORAL | 0 refills | Status: DC

## 2020-09-19 NOTE — Telephone Encounter (Signed)
LeighAnn Johnae Friley, CMA  

## 2020-09-26 ENCOUNTER — Ambulatory Visit (HOSPITAL_COMMUNITY): Payer: 59 | Admitting: Anesthesiology

## 2020-09-26 ENCOUNTER — Other Ambulatory Visit: Payer: Self-pay

## 2020-09-26 ENCOUNTER — Ambulatory Visit: Payer: 59 | Admitting: Physician Assistant

## 2020-09-26 ENCOUNTER — Encounter (HOSPITAL_COMMUNITY)
Admission: RE | Disposition: A | Discharge status: Home / Self Care | Payer: Self-pay | Source: Home / Self Care | Attending: Gastroenterology

## 2020-09-26 ENCOUNTER — Ambulatory Visit (HOSPITAL_COMMUNITY)
Admission: RE | Admit: 2020-09-26 | Discharge: 2020-09-26 | Disposition: A | Discharge status: Home / Self Care | Payer: 59 | Attending: Gastroenterology | Admitting: Gastroenterology

## 2020-09-26 ENCOUNTER — Encounter (HOSPITAL_COMMUNITY): Payer: Self-pay | Admitting: Gastroenterology

## 2020-09-26 DIAGNOSIS — D124 Benign neoplasm of descending colon: Secondary | ICD-10-CM | POA: Diagnosis not present

## 2020-09-26 DIAGNOSIS — Z1211 Encounter for screening for malignant neoplasm of colon: Secondary | ICD-10-CM | POA: Insufficient documentation

## 2020-09-26 DIAGNOSIS — K644 Residual hemorrhoidal skin tags: Secondary | ICD-10-CM | POA: Diagnosis not present

## 2020-09-26 DIAGNOSIS — K648 Other hemorrhoids: Secondary | ICD-10-CM | POA: Diagnosis not present

## 2020-09-26 DIAGNOSIS — Z8719 Personal history of other diseases of the digestive system: Secondary | ICD-10-CM | POA: Insufficient documentation

## 2020-09-26 DIAGNOSIS — Z79899 Other long term (current) drug therapy: Secondary | ICD-10-CM | POA: Insufficient documentation

## 2020-09-26 DIAGNOSIS — K635 Polyp of colon: Secondary | ICD-10-CM | POA: Insufficient documentation

## 2020-09-26 DIAGNOSIS — D123 Benign neoplasm of transverse colon: Secondary | ICD-10-CM | POA: Diagnosis not present

## 2020-09-26 HISTORY — PX: COLONOSCOPY WITH PROPOFOL: SHX5780

## 2020-09-26 HISTORY — PX: POLYPECTOMY: SHX5525

## 2020-09-26 LAB — HM COLONOSCOPY

## 2020-09-26 SURGERY — COLONOSCOPY WITH PROPOFOL
Anesthesia: General

## 2020-09-26 MED ORDER — PROPOFOL 500 MG/50ML IV EMUL
INTRAVENOUS | Status: DC | PRN
  Administered 2020-09-26: 150 ug/kg/min via INTRAVENOUS
  Administered 2020-09-26: 110 ug/kg/min via INTRAVENOUS
  Administered 2020-09-26: 170 ug/kg/min via INTRAVENOUS

## 2020-09-26 MED ORDER — STERILE WATER FOR IRRIGATION IR SOLN
Status: DC | PRN
  Administered 2020-09-26: 100 mL

## 2020-09-26 MED ORDER — LACTATED RINGERS IV SOLN: INTRAVENOUS | Status: DC

## 2020-09-26 MED ORDER — LIDOCAINE HCL (CARDIAC) PF 50 MG/5ML IV SOSY
PREFILLED_SYRINGE | INTRAVENOUS | Status: DC | PRN
  Administered 2020-09-26: 50 mg via INTRAVENOUS

## 2020-09-26 MED ORDER — PROPOFOL 10 MG/ML IV BOLUS
INTRAVENOUS | Status: DC | PRN
  Administered 2020-09-26: 150 mg via INTRAVENOUS
  Administered 2020-09-26: 50 mg via INTRAVENOUS

## 2020-09-26 NOTE — Discharge Instructions (Signed)
You are being discharged to home.  Resume your previous diet.  We are waiting for your pathology results.  Your physician has recommended a repeat colonoscopy in three years for surveillance, will need a two day prep.

## 2020-09-26 NOTE — Anesthesia Postprocedure Evaluation (Signed)
Anesthesia Post Note  Patient: Phylis Bougie  Procedure(s) Performed: COLONOSCOPY WITH PROPOFOL POLYPECTOMY  Patient location during evaluation: Endoscopy Anesthesia Type: General Level of consciousness: awake and alert and oriented Pain management: pain level controlled Vital Signs Assessment: post-procedure vital signs reviewed and stable Respiratory status: spontaneous breathing and respiratory function stable Cardiovascular status: blood pressure returned to baseline and stable Postop Assessment: no apparent nausea or vomiting Anesthetic complications: no   No notable events documented.   Last Vitals:  Vitals:   09/26/20 0949 09/26/20 1223  BP: (!) 148/65 116/62  Pulse:    Resp:  16  Temp:  36.5 C  SpO2:  93%    Last Pain:  Vitals:   09/26/20 1223  TempSrc: Oral  PainSc: 0-No pain                 Sadeel Fiddler C Zinnia Tindall

## 2020-09-26 NOTE — Anesthesia Preprocedure Evaluation (Addendum)
Anesthesia Evaluation  Patient identified by MRN, date of birth, ID band Patient awake    Reviewed: Allergy & Precautions, NPO status , Patient's Chart, lab work & pertinent test results  History of Anesthesia Complications Negative for: history of anesthetic complications  Airway Mallampati: II  TM Distance: >3 FB Neck ROM: Full    Dental  (+) Dental Advisory Given, Caps Crowns x2:   Pulmonary pneumonia, resolved,    Pulmonary exam normal breath sounds clear to auscultation       Cardiovascular Exercise Tolerance: Good Normal cardiovascular exam Rhythm:Regular Rate:Normal     Neuro/Psych PSYCHIATRIC DISORDERS Anxiety Depression negative neurological ROS     GI/Hepatic Neg liver ROS, GERD  Medicated,  Endo/Other  Morbid obesity  Renal/GU negative Renal ROS     Musculoskeletal  (+) Arthritis , Osteoarthritis,    Abdominal   Peds  Hematology negative hematology ROS (+)   Anesthesia Other Findings   Reproductive/Obstetrics                            Anesthesia Physical Anesthesia Plan  ASA: 3  Anesthesia Plan: General   Post-op Pain Management:    Induction: Intravenous  PONV Risk Score and Plan: TIVA  Airway Management Planned: Nasal Cannula and Natural Airway  Additional Equipment:   Intra-op Plan:   Post-operative Plan:   Informed Consent: I have reviewed the patients History and Physical, chart, labs and discussed the procedure including the risks, benefits and alternatives for the proposed anesthesia with the patient or authorized representative who has indicated his/her understanding and acceptance.     Dental advisory given  Plan Discussed with: CRNA and Surgeon  Anesthesia Plan Comments:         Anesthesia Quick Evaluation

## 2020-09-26 NOTE — Transfer of Care (Signed)
Immediate Anesthesia Transfer of Care Note  Patient: Allison Watts  Procedure(s) Performed: COLONOSCOPY WITH PROPOFOL POLYPECTOMY  Patient Location: Endoscopy Unit  Anesthesia Type:General  Level of Consciousness: awake and patient cooperative  Airway & Oxygen Therapy: Patient Spontanous Breathing  Post-op Assessment: Report given to RN and Post -op Vital signs reviewed and stable  Post vital signs: Reviewed and stable  Last Vitals:  Vitals Value Taken Time  BP    Temp    Pulse    Resp    SpO2    SEE VITAL SIGN FLOW SHEET  Last Pain:  Vitals:   09/26/20 1223  TempSrc: Oral  PainSc: 0-No pain      Patients Stated Pain Goal: 8 ([id number])  Complications: No notable events documented.

## 2020-09-26 NOTE — Interval H&P Note (Signed)
History and Physical Interval Note:  09/26/2020 10:18 AM  Alyne A Arzt is a 60 y.o. female with past medical history of GERD, history of anal fissure, who presents for colorectal cancer screening.  She initially came to the clinic for evaluation of rectal pain, she was found to have an anal tear which was treated with a combination of diltiazem and lidocaine, as well as with MiraLAX.  The anal tear has completely healed and she denies having any pain in her rectum.  Denies having any complaints.  BP (!) 148/65   Pulse 77   Temp 97.7 F (36.5 C) (Oral)   Resp 19   Ht '5\' 7"'$  (1.702 m)   Wt 127 kg   BMI 43.85 kg/m  GENERAL: The patient is AO x3, in no acute distress. HEENT: Head is normocephalic and atraumatic. EOMI are intact. Mouth is well hydrated and without lesions. NECK: Supple. No masses LUNGS: Clear to auscultation. No presence of rhonchi/wheezing/rales. Adequate chest expansion HEART: RRR, normal s1 and s2. ABDOMEN: Soft, nontender, no guarding, no peritoneal signs, and nondistended. BS +. No masses. EXTREMITIES: Without any cyanosis, clubbing, rash, lesions or edema. NEUROLOGIC: AOx3, no focal motor deficit. SKIN: no jaundice, no rashes   Jeannene A Herran  has presented today for surgery, with the diagnosis of Hemorrhoids Anal Fissure.  The various methods of treatment have been discussed with the patient and family. After consideration of risks, benefits and other options for treatment, the patient has consented to  Procedure(s) with comments: COLONOSCOPY WITH PROPOFOL (N/A) - 11:00 as a surgical intervention.  The patient's history has been reviewed, patient examined, no change in status, stable for surgery.  I have reviewed the patient's chart and labs.  Questions were answered to the patient's satisfaction.     Maylon Peppers Mayorga

## 2020-09-26 NOTE — Op Note (Signed)
[facility] Patient Name: Allison Watts Procedure Date: 09/26/2020 11:02 AM MRN: [id number] Date of Birth: [birth year] Attending MD: Maylon Peppers ,  CSN: [id number] Age: 60 Admit Type: Outpatient Procedure:                Colonoscopy Indications:              Screening for colorectal malignant neoplasm Providers:                Maylon Peppers, Caprice Kluver, Aram Candela Referring MD:              Medicines:                Monitored Anesthesia Care Complications:            No immediate complications. Estimated Blood Loss:     Estimated blood loss: none. Procedure:                Pre-Anesthesia Assessment:                           - Prior to the procedure, a History and Physical                            was performed, and patient medications, allergies                            and sensitivities were reviewed. The patient's                            tolerance of previous anesthesia was reviewed.                           - The risks and benefits of the procedure and the                            sedation options and risks were discussed with the                            patient. All questions were answered and informed                            consent was obtained.                           - ASA Grade Assessment: III - A patient with severe                            systemic disease.                           After obtaining informed consent, the colonoscope                            was passed under direct vision. Throughout the                            procedure, the patient's blood pressure, pulse,  and                            oxygen saturations were monitored continuously. The                            [id number]) scope was introduced through                            the anus and advanced to the the terminal ileum.                            The colonoscopy was performed without difficulty.                            The patient tolerated the  procedure well. The                            quality of the bowel preparation was adequate to                            identify polyps 6 mm and larger in size. Scope In: 11:43:39 AM Scope Out: 12:17:09 PM Scope Withdrawal Time: 0 hours 28 minutes 40 seconds  Total Procedure Duration: 0 hours 33 minutes 30 seconds  Findings:      Hemorrhoids were found on perianal exam.      The terminal ileum appeared normal.      Two sessile polyps were found in the descending colon and transverse       colon. The polyps were 4 to 5 mm in size. These polyps were removed with       a cold snare. Resection and retrieval were complete.      Non-bleeding external internal hemorrhoids were found during       retroflexion. The hemorrhoids were medium-sized. Impression:               - Hemorrhoids found on perianal exam.                           - The examined portion of the ileum was normal.                           - Two 4 to 5 mm polyps in the descending colon and                            in the transverse colon, removed with a cold snare.                            Resected and retrieved.                           - Non-bleeding external internal hemorrhoids. Moderate Sedation:      Per Anesthesia Care Recommendation:           - Discharge patient to home (ambulatory).                           -  Resume previous diet.                           - Await pathology results.                           - Repeat colonoscopy in 3 years for surveillance,                            will need a two day prep. Procedure Code(s):        --- Professional ---                           [id number], Colonoscopy, flexible; with removal of                            tumor(s), polyp(s), or other lesion(s) by snare                            technique Diagnosis Code(s):        --- Professional ---                           Z12.11, Encounter for screening for malignant                            neoplasm of colon                            K64.4, Residual hemorrhoidal skin tags                           K64.8, Other hemorrhoids                           K63.5, Polyp of colon CPT copyright 2019 [employer]. All rights reserved. The codes documented in this report are preliminary and upon coder review may  be revised to meet current compliance requirements. Maylon Peppers, MD Maylon Peppers,  09/26/2020 12:26:24 PM This report has been signed electronically. Number of Addenda: 0

## 2020-09-26 NOTE — Anesthesia Procedure Notes (Signed)
Date/Time: 09/26/2020 11:36 AM Performed by: Vista Deck, CRNA Pre-anesthesia Checklist: Patient identified, Emergency Drugs available, Suction available, Timeout performed and Patient being monitored Patient Re-evaluated:Patient Re-evaluated prior to induction Oxygen Delivery Method: Non-rebreather mask

## 2020-09-27 LAB — SURGICAL PATHOLOGY

## 2020-10-03 ENCOUNTER — Encounter (INDEPENDENT_AMBULATORY_CARE_PROVIDER_SITE_OTHER): Payer: Self-pay | Admitting: *Deleted

## 2020-10-04 ENCOUNTER — Encounter (HOSPITAL_COMMUNITY): Payer: Self-pay | Admitting: Gastroenterology

## 2020-12-25 ENCOUNTER — Ambulatory Visit (INDEPENDENT_AMBULATORY_CARE_PROVIDER_SITE_OTHER): Payer: 59 | Admitting: Internal Medicine

## 2021-08-22 ENCOUNTER — Ambulatory Visit (HOSPITAL_COMMUNITY)
Admission: RE | Admit: 2021-08-22 | Discharge: 2021-08-22 | Disposition: A | Discharge status: Home / Self Care | Payer: 59 | Source: Ambulatory Visit | Attending: Internal Medicine | Admitting: Internal Medicine

## 2021-08-22 ENCOUNTER — Other Ambulatory Visit (HOSPITAL_COMMUNITY): Payer: Self-pay | Admitting: Internal Medicine

## 2021-08-22 DIAGNOSIS — Z1231 Encounter for screening mammogram for malignant neoplasm of breast: Secondary | ICD-10-CM | POA: Diagnosis present

## 2021-09-07 IMAGING — US US BREAST*L* LIMITED INC AXILLA
1 series · 6 of 6 positions shown · non-contrast
Comparison: Previous exam(s).

CLINICAL DATA: Screening recall for a possible left breast mass.

EXAM:
DIGITAL DIAGNOSTIC UNILATERAL LEFT MAMMOGRAM WITH TOMO AND CAD;
ULTRASOUND LEFT BREAST LIMITED

[Series 1: us breast*left* limited inc axilla · 0.07mm/px · 6 of 6 slices shown]
[im 1/6]
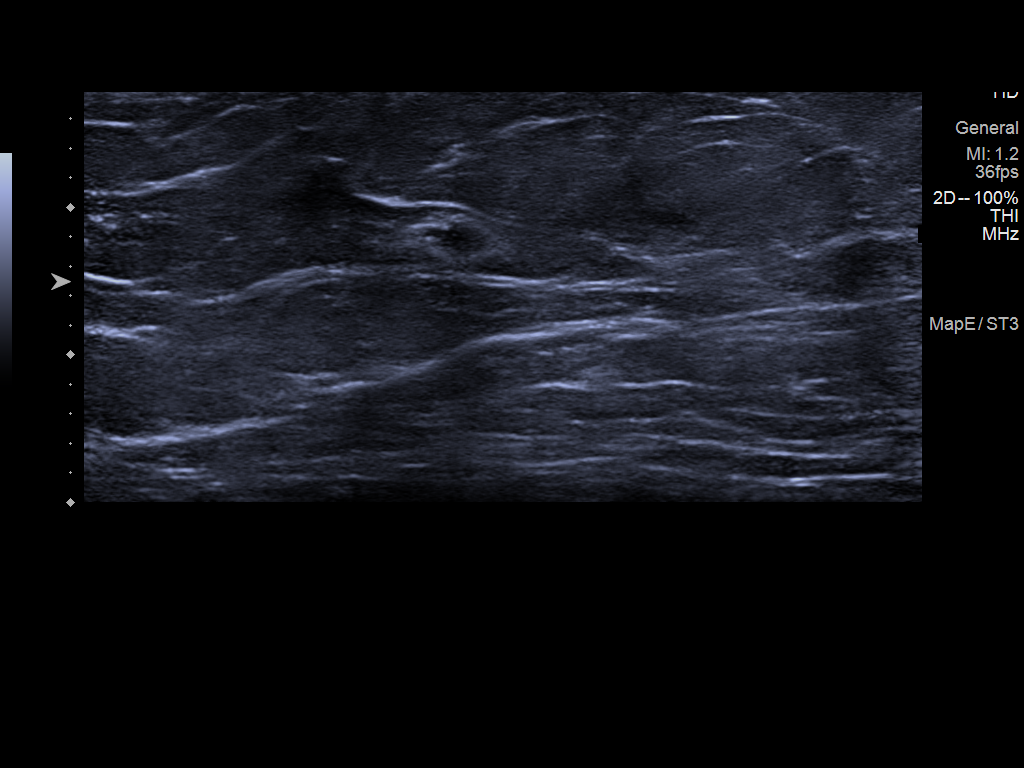
[im 2/6]
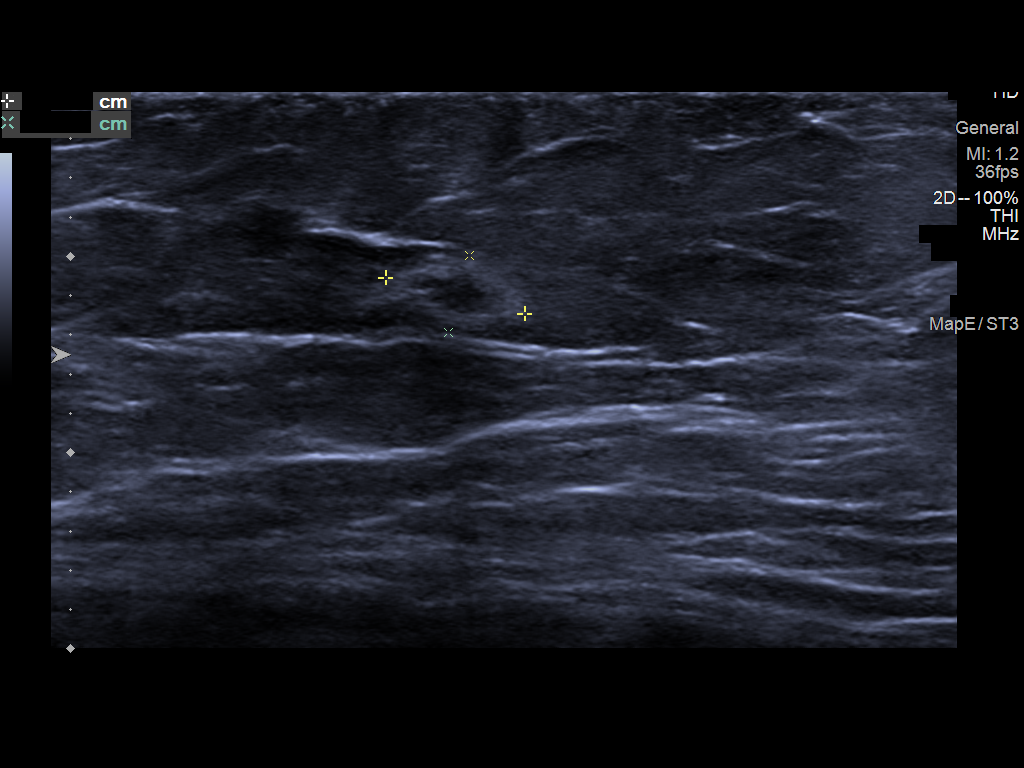
[im 3/6]
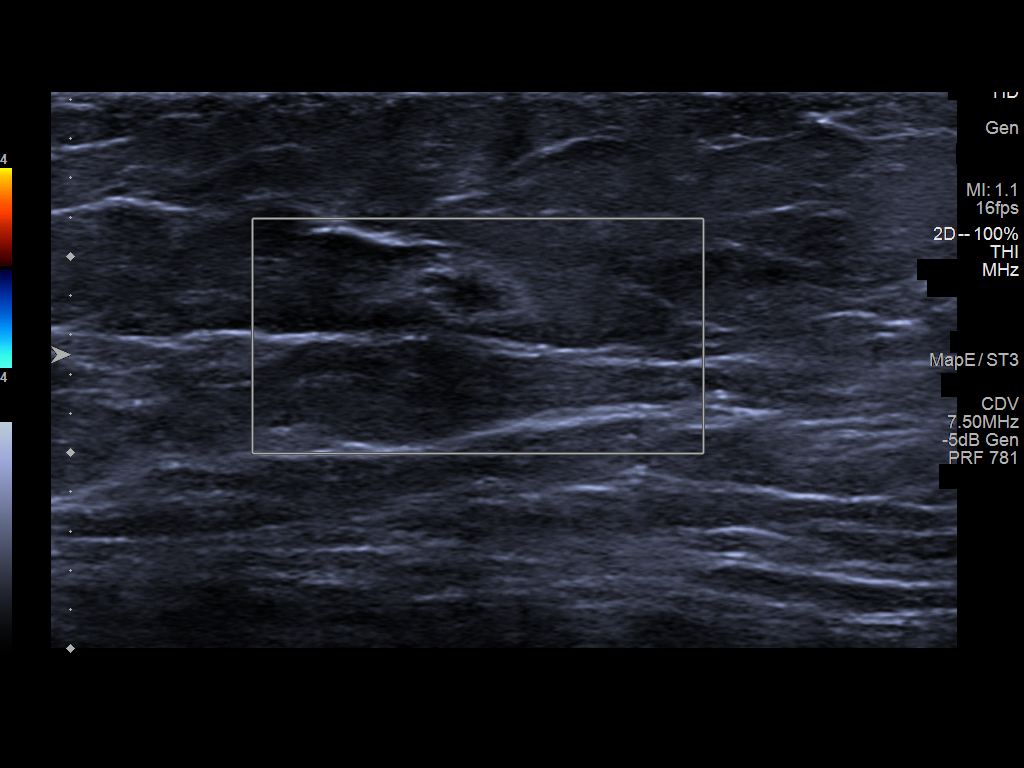
[im 4/6]
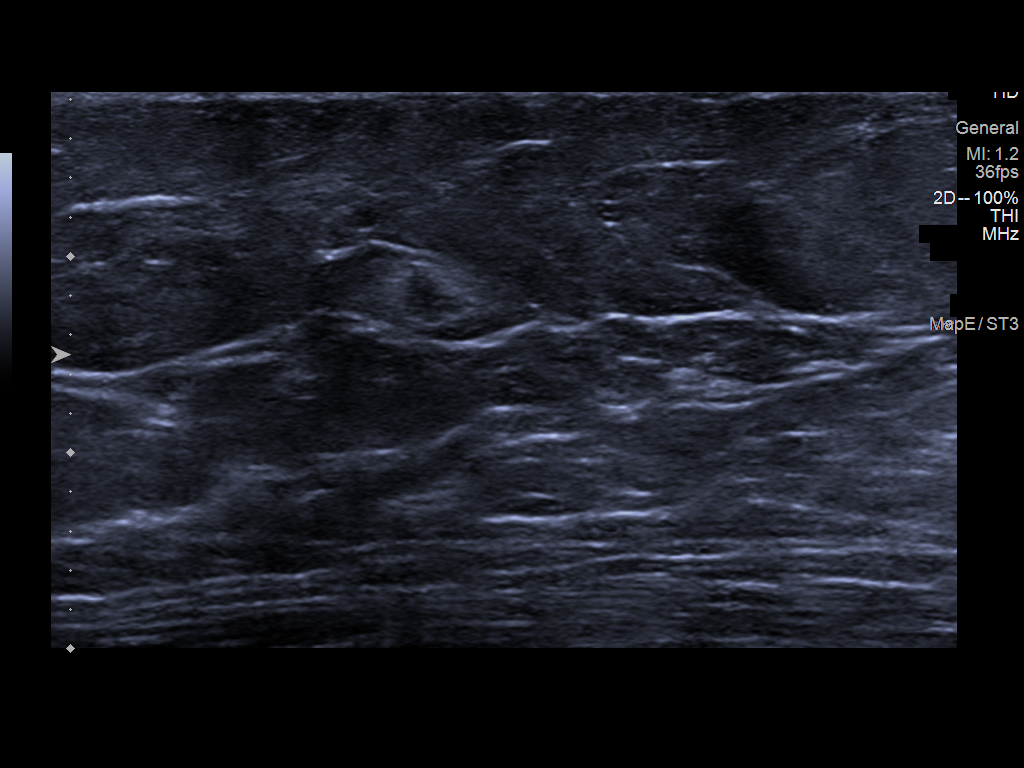
[im 5/6]
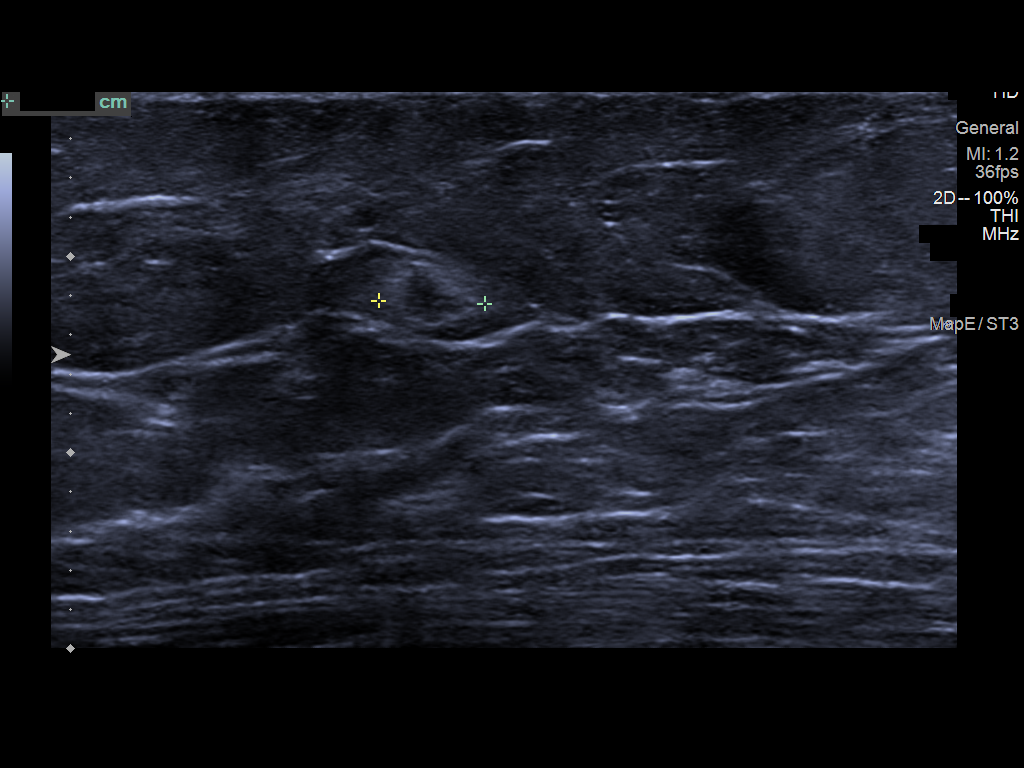
[im 6/6]
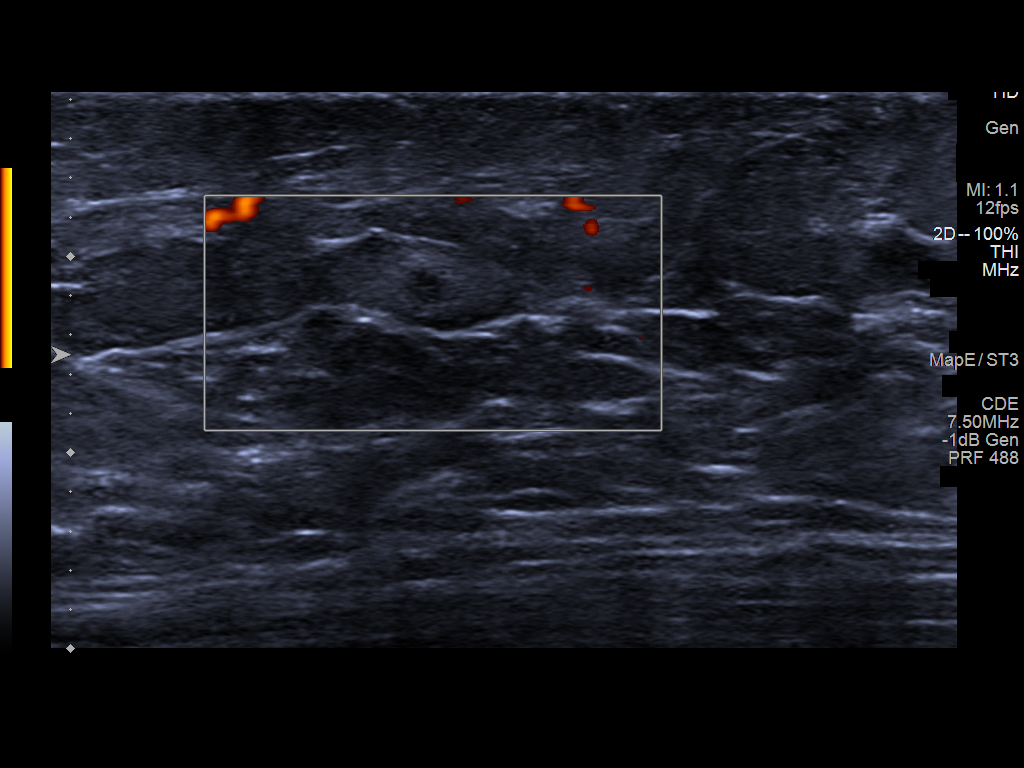

[6 of 6 positions shown; findings below may reference images not displayed]

ACR Breast Density Category b: There are scattered areas of
fibroglandular density.
FINDINGS: Spot compression tomosynthesis images through the upper slightly
inner quadrant of the left breast demonstrates an oval fat
containing mass measuring approximately 4 mm. This mass is less
apparent than that seen on the screening mammogram, possibly
representing fat necrosis.

Mammographic images were processed with CAD.

Ultrasound targeted to the left breast at [DATE], 10 cm from the
nipple demonstrates a hypoechoic oval mass with an echogenic rind
measuring approximately 7 x 4 x 5 mm.
IMPRESSION: There is a 7 mm mass in the left breast at [DATE], likely
representing benign fat necrosis.

RECOMMENDATION:
Three-month follow-up left breast mammogram and ultrasound.

I have discussed the findings and recommendations with the patient.
If applicable, a reminder letter will be sent to the patient
regarding the next appointment.

BI-RADS CATEGORY  3: Probably benign.

## 2021-09-07 IMAGING — MG MM DIGITAL DIAGNOSTIC UNILAT*L* W/ TOMO W/ CAD
4 series · 4 of 12 positions shown · non-contrast
Comparison: Previous exam(s).

CLINICAL DATA: Screening recall for a possible left breast mass.

EXAM:
DIGITAL DIAGNOSTIC UNILATERAL LEFT MAMMOGRAM WITH TOMO AND CAD;
ULTRASOUND LEFT BREAST LIMITED

[L MLO synth-2D]
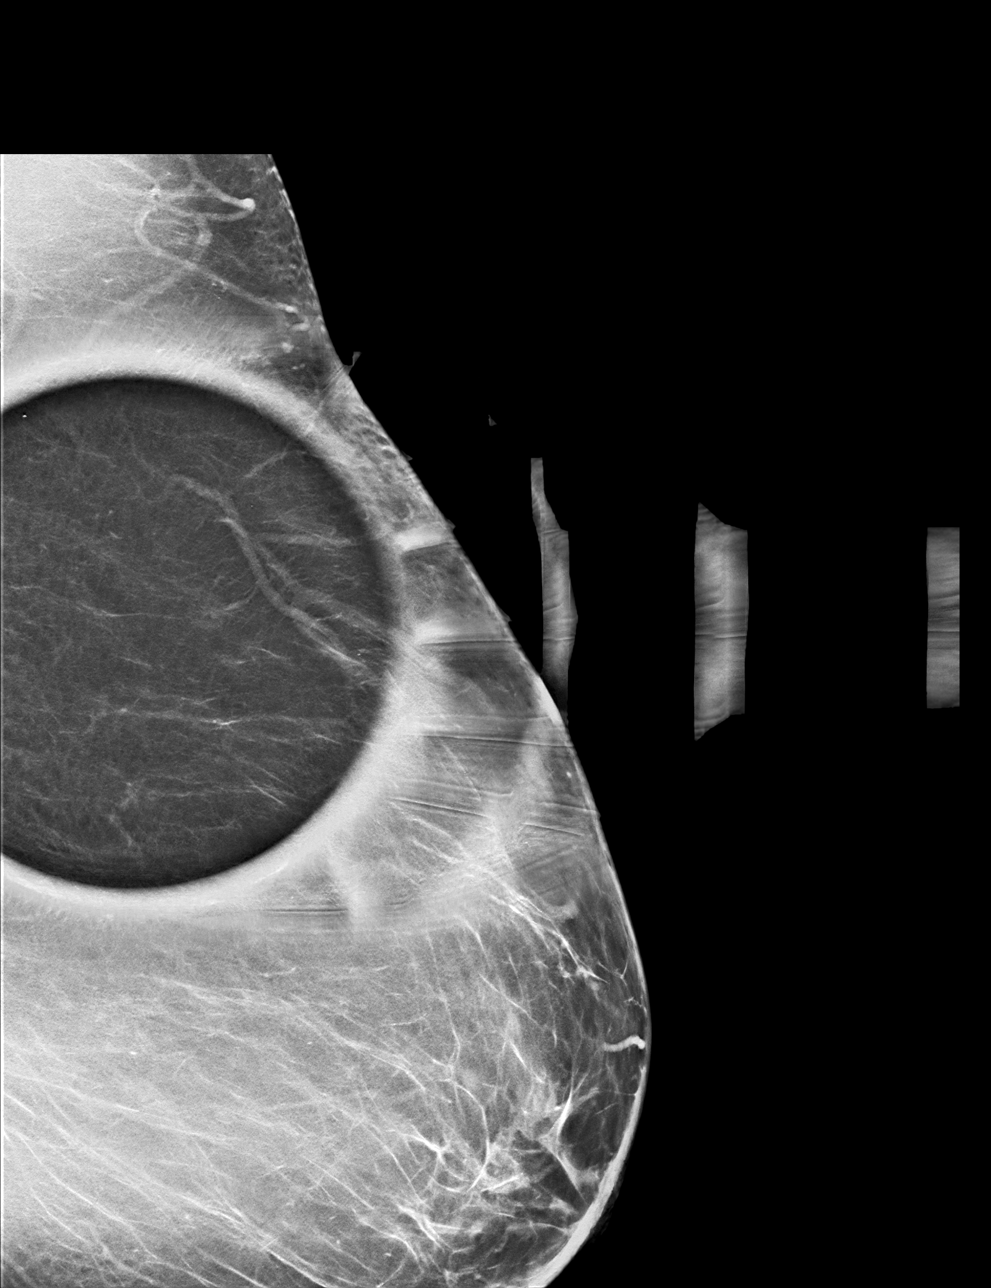

[L CC synth-2D]
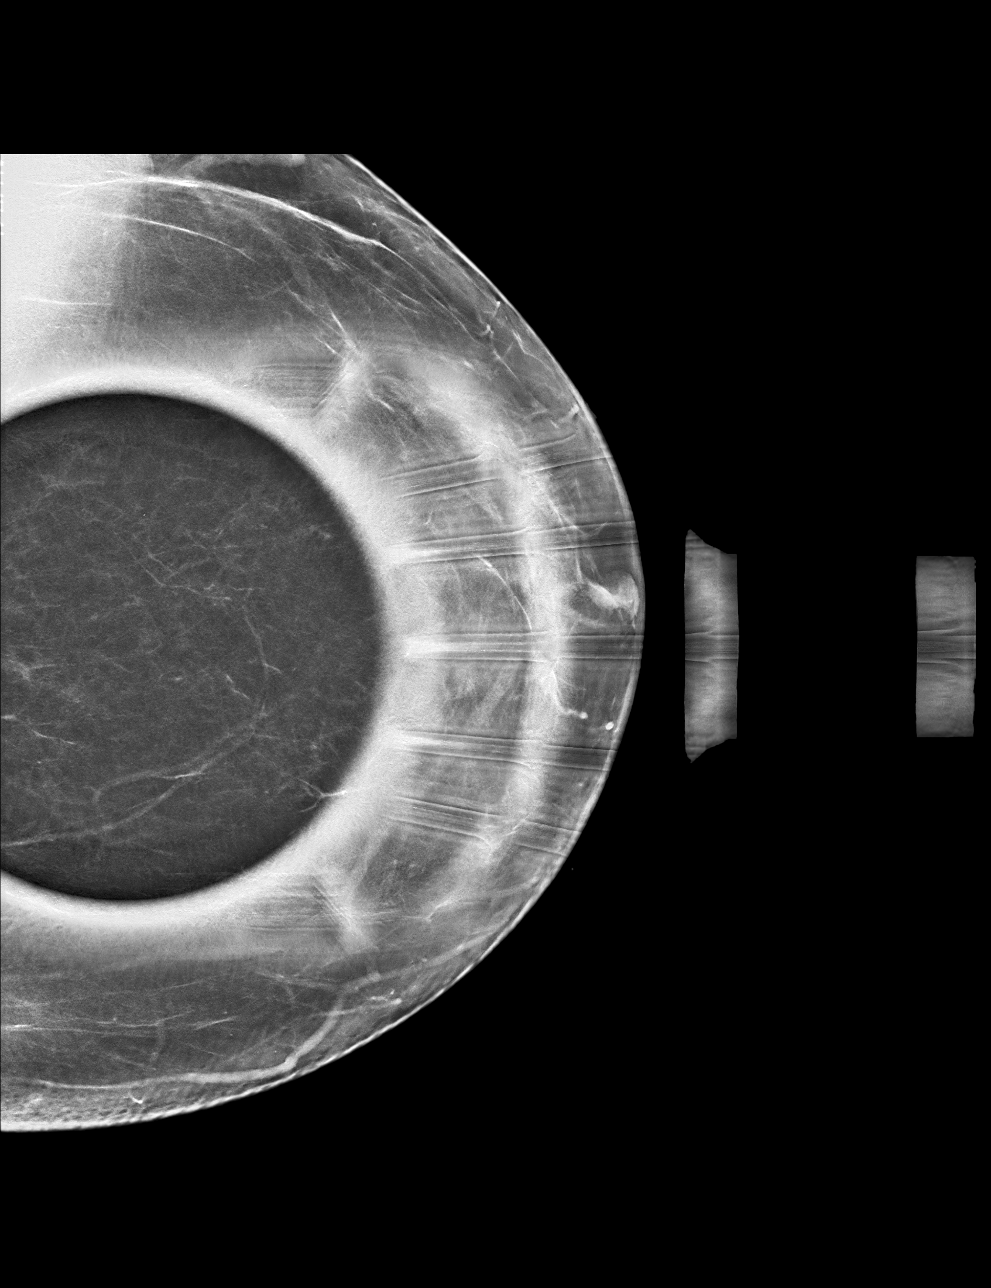

[L CC tomo · tomo slice 31/61.0]
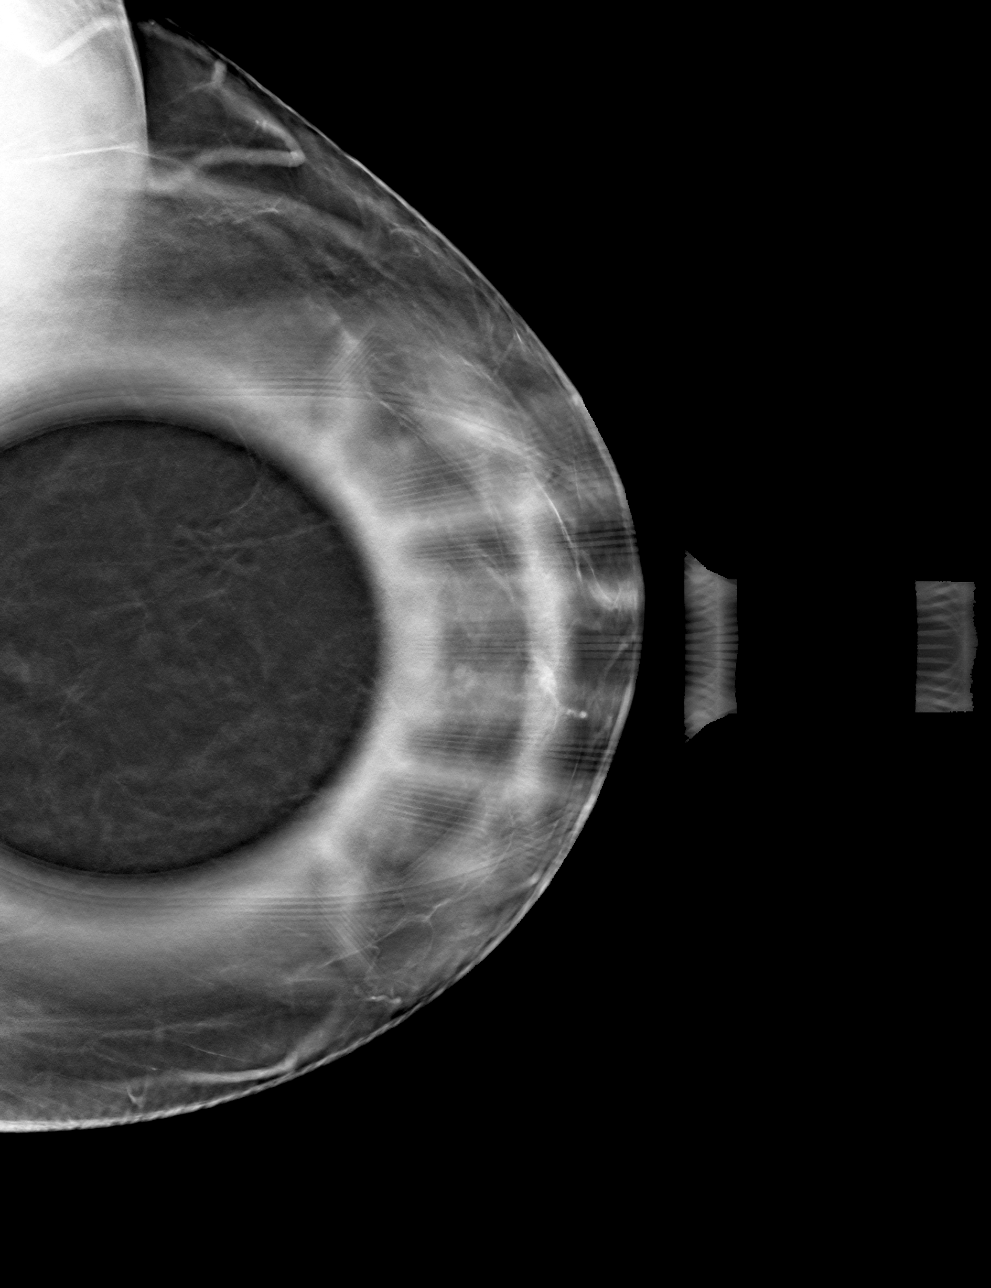

[L MLO tomo · tomo slice 33/64.0]
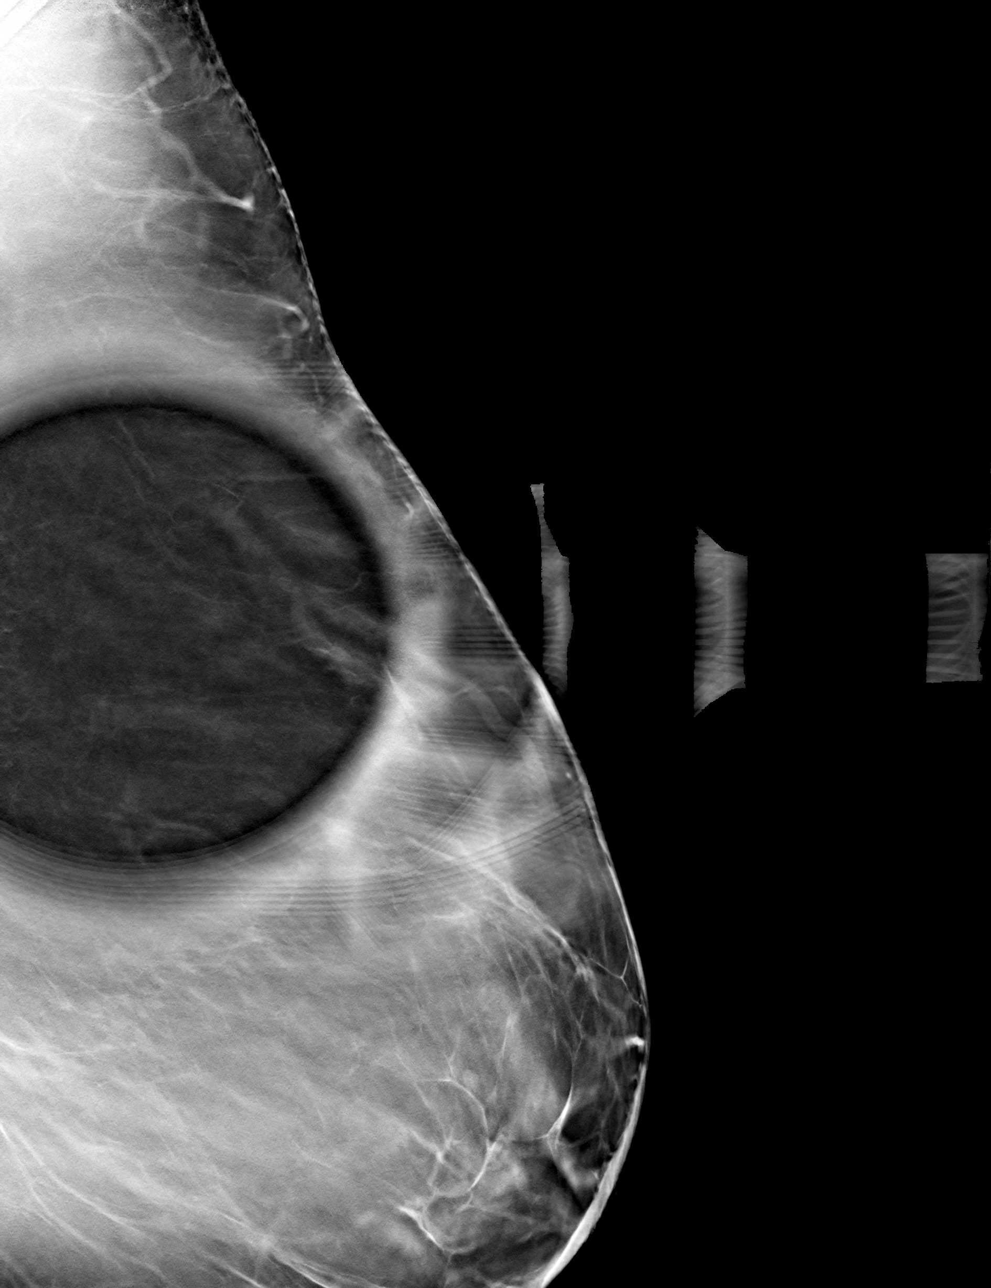

[4 of 12 positions shown; findings below may reference images not displayed]

ACR Breast Density Category b: There are scattered areas of
fibroglandular density.
FINDINGS: Spot compression tomosynthesis images through the upper slightly
inner quadrant of the left breast demonstrates an oval fat
containing mass measuring approximately 4 mm. This mass is less
apparent than that seen on the screening mammogram, possibly
representing fat necrosis.

Mammographic images were processed with CAD.

Ultrasound targeted to the left breast at [DATE], 10 cm from the
nipple demonstrates a hypoechoic oval mass with an echogenic rind
measuring approximately 7 x 4 x 5 mm.
IMPRESSION: There is a 7 mm mass in the left breast at [DATE], likely
representing benign fat necrosis.

RECOMMENDATION:
Three-month follow-up left breast mammogram and ultrasound.

I have discussed the findings and recommendations with the patient.
If applicable, a reminder letter will be sent to the patient
regarding the next appointment.

BI-RADS CATEGORY  3: Probably benign.

## 2023-04-20 ENCOUNTER — Other Ambulatory Visit (HOSPITAL_COMMUNITY): Payer: Self-pay | Admitting: Internal Medicine

## 2023-04-20 DIAGNOSIS — Z1231 Encounter for screening mammogram for malignant neoplasm of breast: Secondary | ICD-10-CM

## 2023-04-29 ENCOUNTER — Ambulatory Visit (HOSPITAL_COMMUNITY)

## 2023-05-06 ENCOUNTER — Ambulatory Visit (HOSPITAL_COMMUNITY)
Admission: RE | Admit: 2023-05-06 | Discharge: 2023-05-06 | Disposition: A | Discharge status: Home / Self Care | Source: Ambulatory Visit | Attending: Internal Medicine | Admitting: Internal Medicine

## 2023-05-06 ENCOUNTER — Encounter (HOSPITAL_COMMUNITY): Payer: Self-pay

## 2023-05-06 DIAGNOSIS — Z1231 Encounter for screening mammogram for malignant neoplasm of breast: Secondary | ICD-10-CM | POA: Insufficient documentation
